# Patient Record
Sex: Female | Born: 1965 | Race: White | Hispanic: No | Smoking: Never smoker
Health system: Southern US, Community
[De-identification: ages and names within clinical notes are randomized; demographics above are authoritative.]

## PROBLEM LIST (undated history)

## (undated) DIAGNOSIS — S060XAA Concussion with loss of consciousness status unknown, initial encounter: Secondary | ICD-10-CM

## (undated) DIAGNOSIS — S060X9A Concussion with loss of consciousness of unspecified duration, initial encounter: Secondary | ICD-10-CM

## (undated) HISTORY — PX: CHOLECYSTECTOMY: SHX55

## (undated) HISTORY — DX: Concussion with loss of consciousness status unknown, initial encounter: S06.0XAA

## (undated) HISTORY — DX: Concussion with loss of consciousness of unspecified duration, initial encounter: S06.0X9A

---

## 1998-07-23 ENCOUNTER — Other Ambulatory Visit: Admission: RE | Admit: 1998-07-23 | Discharge: 1998-07-23 | Payer: Self-pay | Admitting: Obstetrics & Gynecology

## 1999-08-09 ENCOUNTER — Other Ambulatory Visit: Admission: RE | Admit: 1999-08-09 | Discharge: 1999-08-09 | Payer: Self-pay | Admitting: Obstetrics & Gynecology

## 2000-10-24 ENCOUNTER — Other Ambulatory Visit: Admission: RE | Admit: 2000-10-24 | Discharge: 2000-10-24 | Payer: Self-pay | Admitting: Obstetrics & Gynecology

## 2001-03-26 ENCOUNTER — Other Ambulatory Visit: Admission: RE | Admit: 2001-03-26 | Discharge: 2001-03-26 | Payer: Self-pay | Admitting: Obstetrics & Gynecology

## 2002-03-06 ENCOUNTER — Other Ambulatory Visit: Admission: RE | Admit: 2002-03-06 | Discharge: 2002-03-06 | Payer: Self-pay | Admitting: Obstetrics & Gynecology

## 2003-04-06 ENCOUNTER — Other Ambulatory Visit: Admission: RE | Admit: 2003-04-06 | Discharge: 2003-04-06 | Payer: Self-pay | Admitting: Obstetrics & Gynecology

## 2004-06-24 ENCOUNTER — Other Ambulatory Visit: Admission: RE | Admit: 2004-06-24 | Discharge: 2004-06-24 | Payer: Self-pay | Admitting: Obstetrics & Gynecology

## 2005-10-17 ENCOUNTER — Other Ambulatory Visit: Admission: RE | Admit: 2005-10-17 | Discharge: 2005-10-17 | Payer: Self-pay | Admitting: Obstetrics & Gynecology

## 2007-03-23 ENCOUNTER — Inpatient Hospital Stay (HOSPITAL_COMMUNITY): Admission: EM | Admit: 2007-03-23 | Discharge: 2007-03-28 | Payer: Self-pay | Admitting: Emergency Medicine

## 2007-03-23 ENCOUNTER — Encounter: Payer: Self-pay | Admitting: Emergency Medicine

## 2007-04-08 ENCOUNTER — Encounter: Admission: RE | Admit: 2007-04-08 | Discharge: 2007-04-08 | Payer: Self-pay | Admitting: Neurosurgery

## 2011-01-10 NOTE — Consult Note (Signed)
[name], Morris NO.:  [id number]   MEDICAL RECORD NO.:  [id number]          PATIENT TYPE:  INP   LOCATION:  3105                         FACILITY:  [facility]   PHYSICIAN:  Ardeth Sportsman, MD     DATE OF BIRTH:  December 06, 1965   DATE OF CONSULTATION:  DATE OF DISCHARGE:                                 CONSULTATION   PRIMARY CARE PHYSICIAN:  Dr. Mayford Knife, [facility], although she  does go to [facility].   REQUESTING PHYSICIAN:  Margarita Grizzle, RN.   NEUROSURGEON:  Dr. Phoebe Perch.   REASON FOR CONSULTATION:  Fall off horse, with subdural and subarachnoid  hemorrhages and a skull fracture.   HISTORY OF PRESENT ILLNESS:  [name] is a 45 year old female  otherwise pretty healthy, who was riding her horse with her father.  She  cannot recall everything exactly, but apparently the horse was trying to  go between a trot and a gallop, and she thinks she fell forward.  Her  father just remembers looking around and finding her staggering near her  horse.  There was no loss of consciousness, but she is amnestic of part  of the event.  She is complaining of headache and nausea.  She has never  had anything like this before.  She cannot recall any chest pain or  shortness of breath or any vision changes or seizures just prior to  falling off the horse.  She was sent to [place] while in the emergency  department and, based on her evaluation, was transferred over to [facility] -  for trauma evaluation.  She has been seen by Dr. Phoebe Perch with  neurosurgery, and he requested theat the trauma service follow the  patient with him.   PAST MEDICAL HISTORY:  Negative.   PAST SURGICAL HISTORY:  Cholecystectomy.   SOCIAL HISTORY:  Occasionally drinks alcohol, but not recently.  No  tobacco or other drug use.  Works at [employer].   FAMILY HISTORY:  Noncontributory for any major neurological or cardiac  problems.   ALLERGIES:  NONE.   MEDICATIONS:  None.   REVIEW OF  SYSTEMS:  A 15-point review of systems is otherwise rather  normal.  OPHTHALMOLOGIC, ENT, CARDIAC, PULMONARY, GYN, HEPATIC, RENAL,  ENDOCRINE, HEME, LYMPH, ALLERGIES, MUSCULOSKELETAL, NEUROLOGICAL,  PSYCHIATRIC otherwise are negative.  NEUROLOGICAL:  Otherwise noted as  above.  No vision changes or auditory changes.   PHYSICAL EXAMINATION:  VITAL SIGNS:  Her temperature has not been  recorded yet.  Pulse of 91.  Respirations of 20.  Blood pressure 113/64.  Room air 96% to 100%.  Her pain is about a 5 to 6/10.  GENERAL:  She is awake, tired, but uncomfortable, but not frankly toxic.  She is oriented x4.  PSYCH:  She is interactive and seems to have a pretty good immediate and  long-term memory.  There is no evidence of dementia.  Denied any  psychosis or paranoia.  EYES:  Pupils are equal, round and reactive to light.  Extraocular  movements are intact.  Sclerae are not icteric or injected.  There  is no  hyphema.  HEENT:  She seems to be normocephalic.  She does have some tenderness in  her right occiput, but no definite step-off.  Orbit is fine. No raccoon  eyes or battle sign.  Mucous membranes, oropharynx is clear.  She has no  nasal drip or post nasal drip.  Tympanic membranes are clear, with no  hemotympanum.  NECK:  She is already out of the collar.  She has full range of motion  without any pain or discomfort.  Trachea is midline.  No step-off.  HEART:  Regular rate and rhythm.  No murmurs, gallops or rubs.  CHEST:  Clear to auscultation bilaterally.  No wheezes rales or rhonchi.  No pain to rib on strong compression.  BACK:  No pain along her cervicothoracolumbosacral spine, with no  obvious deformity.  ABDOMEN:  The abdomen is soft and nontender, nondistended.  She is  obese.  No umbilical or incisional hernias.  GU:  Normal female genitalia, with no inguinal hernias.  RECTAL:  Refused by patient.  EXTREMITIES:  No clubbing, cyanosis or edema.  MUSCULOSKELETAL:  Full range  of motion of shoulders, elbows, wrists, as  well as hips, knees, and ankles.  There is no pain in her hand or other  major joints or long bones.  LYMPH:  No head, neck, axillary or groin lymphadenopathy.  SKIN:  No obvious petechia.  No Racoon eyes or battle sign.  No other  sores or lesions.   LABORATORY VALUES:  None were drawn.  Chest x-ray is completely normal.  No rib fracture.  CT of the head shows a subtle, but definite  subarachnoid hemorrhage in bilateral frontal and temporal regions.  She  has some possible small subdural hematoma bilaterally, less than 2 mm in  size.  She has a left parietal occipital fracture and some fluid in her  right maxillary sinus.  It is unclear whether blood.  CT of the neck is  normal, except for some slight loss of curvature, but no herniation or  fracture.   ASSESSMENT AND PLAN:  5. A 45 year old female status post fall with subarachnoid and      subdural hemorrhage.  2. We discussed the case with Dr. Phoebe Perch.  He feels comfortable      admitting the patient as long as we will help follow with them to      make sure we are not missing anything.   Therefore I recommend:  1. CBC and basic metabolic panel and urinalysis, to make sure we are      not missing anything.  2. Serial abdominal exams.  3. Probably could advance diet when nausea is under better control.  4. Aggressive pain control.  5. Agree with followup CT scan in the next 24 to 36 hours to make sure      there is no progression of any abnormalities. 6) Avoid any      anticoagulation.  6. DVT prophylaxis with SCD.  7. Ulcer prophylaxis with Protonix until eating a regular diet.  8. Neurosurgical care.     Ardeth Sportsman, MD  Electronically Signed    SCG/MEDQ  D:  03/23/2007  T:  03/23/2007  Job:  [id number]

## 2011-01-13 NOTE — Discharge Summary (Signed)
[name], CUPO NO.:  [id number]   MEDICAL RECORD NO.:  [id number]          PATIENT TYPE:  INP   LOCATION:  3031                         FACILITY:  [facility]   PHYSICIAN:  Clydene Fake, M.D.  DATE OF BIRTH:  August 03, 1966   DATE OF ADMISSION:  03/23/2007  DATE OF DISCHARGE:  03/28/2007                               DISCHARGE SUMMARY   DIAGNOSIS:  Closed head injury.   DISCHARGE DIAGNOSES:  None.   PROCEDURES:  None.   REASON FOR ADMISSION:  A 45 year old white female who fell off a horse,  causing amnesia for Veronica event, got up and ambulated at Veronica scene, came  in with a headache, was brought to [facility] emergency room.  CT scan was  done.  There is concern for bitemporal small subdural hematomas.  No  mass effect, early contusions, subarachnoid hemorrhage frontal base,  left greater than right, with no mass effect in Veronica occipital, no  displaced skull fracture.  C-spine negative.  Veronica patient admitted in  Veronica ICU for close observation.  Trauma team consulted for concurrent  care.  Veronica patient remained neurologically intact.  During Veronica next  couple of days on Veronica 28th, another CT was done showing resolution of  bitemporal subdural hematomas.  Contusion as well as some edema was seen  at Veronica base, worse on Veronica left frontal lobe, kind of as expected but no  significant mass effects.  Veronica patient still had some headaches and  nauseousness, was slowly  starting to increase activity and started  getting out of bed.  She continued to slightly improve with her  symptoms.  About Veronica 30th, she still had some slight headaches and  slight dizziness as she was moving around but was ambulating and  starting to eat, less nauseous.  Physical therapy, occupational therapy  worked with Veronica patient, but she continued making improvement and on  March 28, 2007, discharged home in stable condition.  No strenuous  activity with a CT scan of Veronica head in 2 weeks.  Followup in my office  after that.           ______________________________  Clydene Fake, M.D.     JRH/MEDQ  D:  04/25/2007  T:  04/25/2007  Job:  [id number]

## 2011-06-12 LAB — URINE MICROSCOPIC-ADD ON

## 2011-06-12 LAB — BASIC METABOLIC PANEL
BUN: 6
CO2: 25
CO2: 26
Calcium: 8.3 — ABNORMAL LOW
Calcium: 8.9
Calcium: 9
Chloride: 105
Chloride: 105
Chloride: 107
Creatinine, Ser: 0.54
Creatinine, Ser: 0.56
Creatinine, Ser: 0.61
GFR calc Af Amer: >60
GFR calc Af Amer: >60
GFR calc non Af Amer: >60
Glucose, Bld: 111 — ABNORMAL HIGH
Sodium: 140

## 2011-06-12 LAB — CBC
Hemoglobin: 12.3
MCHC: 33.8
MCHC: 34.2
MCV: 88.1
Platelets: 283
RBC: 4.12
WBC: 20.6 — ABNORMAL HIGH
WBC: 23.7 — ABNORMAL HIGH

## 2011-06-12 LAB — PROTIME-INR
INR: 1
Prothrombin Time: 12.9

## 2011-06-12 LAB — URINALYSIS, ROUTINE W REFLEX MICROSCOPIC
Bilirubin Urine: NEGATIVE
Glucose, UA: NEGATIVE
Protein, ur: NEGATIVE
Urobilinogen, UA: 0.2

## 2011-06-12 LAB — URINE CULTURE
Culture: NO GROWTH
Special Requests: NEGATIVE

## 2012-02-12 ENCOUNTER — Ambulatory Visit (INDEPENDENT_AMBULATORY_CARE_PROVIDER_SITE_OTHER): Payer: 59 | Admitting: Family Medicine

## 2012-02-12 ENCOUNTER — Ambulatory Visit: Payer: 59

## 2012-02-12 VITALS — BP 117/71 | HR 89 | Temp 98.8°F | Resp 18 | Ht 64.5 in | Wt 283.0 lb

## 2012-02-12 DIAGNOSIS — S90852A Superficial foreign body, left foot, initial encounter: Secondary | ICD-10-CM

## 2012-02-12 DIAGNOSIS — M79609 Pain in unspecified limb: Secondary | ICD-10-CM

## 2012-02-12 DIAGNOSIS — M79672 Pain in left foot: Secondary | ICD-10-CM

## 2012-02-12 DIAGNOSIS — S91309A Unspecified open wound, unspecified foot, initial encounter: Secondary | ICD-10-CM

## 2012-02-12 DIAGNOSIS — IMO0002 Reserved for concepts with insufficient information to code with codable children: Secondary | ICD-10-CM

## 2012-02-12 NOTE — Patient Instructions (Signed)

## 2012-02-12 NOTE — Progress Notes (Signed)
Verbal consent obtained from the patient.  Local anesthesia with 3cc Lidocaine 1% without epinephrine.  Wound scrubbed with soap and water and rinsed.  Wound closed with 1 4-0 Prolene horizontal mattress suture.  Wound cleansed and dressed.

## 2012-02-12 NOTE — Progress Notes (Signed)
Subjective: 46 year old lady who cut her left  foot on the glass of the bulb of having been. She was able to pull out the splinter of glass. She did not see a physician yesterday. She cleaned it well with peroxide.  Objective: Moving on mid arch of her foot itches slightly over 1 cm in length. Wound is clean. 1% lidocaine was used to anesthetize locally, and probing with a hemostat does not get a click of glass.  Assessment: Wound  Left foot, approximately 1 cm deep  Plan: X-ray foot Tetanus shot up-to-date 2 years ago Will need irrigating and would probably heal better with a suture in it.  [facility] reading (PRIMARY) by  Dr. Alwyn Ren Negative foot xray for foreign body  Discussed with patient that I believe this will do better with sutures in it, and she agrees.Marland Kitchen

## 2012-02-22 ENCOUNTER — Ambulatory Visit (INDEPENDENT_AMBULATORY_CARE_PROVIDER_SITE_OTHER): Payer: 59 | Admitting: Family Medicine

## 2012-02-22 VITALS — BP 123/77 | HR 91

## 2012-02-22 DIAGNOSIS — Z4802 Encounter for removal of sutures: Secondary | ICD-10-CM

## 2012-02-22 DIAGNOSIS — S91309A Unspecified open wound, unspecified foot, initial encounter: Secondary | ICD-10-CM

## 2012-02-22 NOTE — Progress Notes (Signed)
Subjective: Patient is here to recheck the wound on her foot. It's doing well  Objective: Wound healed nicely. Single suture removed.  Assessment: Wound foot, resolved  Plan: Return when necessary

## 2012-03-27 ENCOUNTER — Ambulatory Visit (INDEPENDENT_AMBULATORY_CARE_PROVIDER_SITE_OTHER): Payer: 59 | Admitting: Emergency Medicine

## 2012-03-27 VITALS — BP 136/76 | HR 117 | Temp 99.7°F | Resp 18 | Ht 65.0 in | Wt 285.0 lb

## 2012-03-27 DIAGNOSIS — J020 Streptococcal pharyngitis: Secondary | ICD-10-CM

## 2012-03-27 MED ORDER — AMOXICILLIN 875 MG PO TABS: 875.0000 mg | ORAL_TABLET | Freq: Two times a day (BID) | ORAL | Status: AC

## 2012-03-27 NOTE — Progress Notes (Signed)
   Date:  03/27/2012   Name:  Veronica Morris   DOB:  02-Sep-1965   MRN:  [id number]  PCP:  Tally Due, MD    Chief Complaint: Sore Throat   History of Present Illness:  Veronica Morris is a 46 y.o. very pleasant female patient who presents with the following:  Yesterday developed sore throat that kept her up all night.  Now has fever and chills.  No nausea or vomiting.  No cough.  Had coryza last week.  No other complaints  Patient Active Problem List  Diagnosis  . Wound, open, foot    No past medical history on file.  No past surgical history on file.  History  Substance Use Topics  . Smoking status: Never Smoker   . Smokeless tobacco: Not on file  . Alcohol Use: Not on file    No family history on file.  No Known Allergies  Medication list has been reviewed and updated.  No current outpatient prescriptions on file prior to visit.    Review of Systems:  As per HPI, otherwise negative.    Physical Examination: Filed Vitals:   03/27/12 1009  BP: 136/76  Pulse: 117  Temp: 99.7 F (37.6 C)  Resp: 18   Filed Vitals:   03/27/12 1009  Height: 5\' 5"  (1.651 m)  Weight: 285 lb (129.275 kg)   Body mass index is 47.43 kg/(m^2). Ideal Body Weight: Weight in (lb) to have BMI = 25: 149.9   GEN: WDWN, NAD, Non-toxic, A & O x 3 HEENT: Atraumatic, Normocephalic. Neck supple. No masses, No LAD.  Tonsils enlarged and red with exudate.  Anterior cervical tenderness Ears and Nose: No external deformity. CV: RRR, No M/G/R. No JVD. No thrill. No extra heart sounds. PULM: CTA B, no wheezes, crackles, rhonchi. No retractions. No resp. distress. No accessory muscle use. ABD: S, NT, ND, +BS. No rebound. No HSM. EXTR: No c/c/e NEURO Normal gait.  PSYCH: Normally interactive. Conversant. Not depressed or anxious appearing.  Calm demeanor.    Assessment and Plan: Strep Amoxicillin Follow up as needed  Carmelina Dane, MD

## 2012-03-27 NOTE — Patient Instructions (Signed)
Strep Infections  Streptococcal (strep) infections are caused by streptococcal germs (bacteria). Strep infections are very contagious. Strep infections can occur in:   Ears.   The nose.   The throat.   Sinuses.   Skin.   Blood.   Lungs.   Spinal fluid.   Urine.  Strep throat is the most common bacterial infection in children. The symptoms of a Strep infection usually get better in 2 to 3 days after starting medicine that kills germs (antibiotics). Strep is usually not contagious after 36 to 48 hours of antibiotic treatment. Strep infections that are not treated can cause serious complications. These include gland infections, throat abscess, rheumatic fever and kidney disease.  DIAGNOSIS   The diagnosis of strep is made by:   A culture for the strep germ.  TREATMENT   These infections require oral antibiotics for a full 10 days, an antibiotic shot or antibiotics given into the vein (intravenous, IV).  HOME CARE INSTRUCTIONS    Be sure to finish all antibiotics even if feeling better.   Only take over-the-counter medicines for pain, discomfort and or fever, as directed by your caregiver.   Close contacts that have a fever, sore throat or illness symptoms should see their caregiver right away.   You or your child may return to work, school or daycare if the fever and pain are better in 2 to 3 days after starting antibiotics.  SEEK MEDICAL CARE IF:    You or your child has an oral temperature above 102 F (38.9 C).   Your baby is older than 3 months with a rectal temperature of 100.5 F (38.1 C) or higher for more than 1 day.   You or your child is not better in 3 days.  SEEK IMMEDIATE MEDICAL CARE IF:    You or your child has an oral temperature above 102 F (38.9 C), not controlled by medicine.   Your baby is older than 3 months with a rectal temperature of 102 F (38.9 C) or higher.   Your baby is 3 months old or younger with a rectal temperature of 100.4 F (38 C) or higher.   There is a  spreading rash.   There is difficulty swallowing or breathing.   There is increased pain or swelling.  Document Released: 09/21/2004 Document Revised: 08/03/2011 Document Reviewed: 06/30/2009  Exit[employer] Patient Information 2012 [employer].

## 2012-04-21 ENCOUNTER — Ambulatory Visit (INDEPENDENT_AMBULATORY_CARE_PROVIDER_SITE_OTHER): Payer: 59 | Admitting: Family Medicine

## 2012-04-21 VITALS — BP 126/81 | HR 92 | Temp 98.0°F | Resp 16 | Ht 64.0 in | Wt 285.0 lb

## 2012-04-21 DIAGNOSIS — R42 Dizziness and giddiness: Secondary | ICD-10-CM

## 2012-04-21 DIAGNOSIS — J321 Chronic frontal sinusitis: Secondary | ICD-10-CM

## 2012-04-21 DIAGNOSIS — J309 Allergic rhinitis, unspecified: Secondary | ICD-10-CM

## 2012-04-21 LAB — POCT URINALYSIS DIPSTICK
Bilirubin, UA: NEGATIVE
Glucose, UA: NEGATIVE
Nitrite, UA: NEGATIVE
pH, UA: 5.5

## 2012-04-21 LAB — POCT CBC
HCT, POC: 43.1 % (ref 37.7–47.9)
Hemoglobin: 12.8 g/dL (ref 12.2–16.2)
Lymph, poc: 2.9 (ref 0.6–3.4)
MCH, POC: 27.4 pg (ref 27–31.2)
MCHC: 29.7 g/dL — AB (ref 31.8–35.4)
MCV: 92 fL (ref 80–97)
MPV: 7.7 fL (ref 0–99.8)
POC MID %: 6.8 %M (ref 0–12)
RBC: 4.68 M/uL (ref 4.04–5.48)
WBC: 10.5 10*3/uL — AB (ref 4.6–10.2)

## 2012-04-21 MED ORDER — FLUTICASONE PROPIONATE 50 MCG/ACT NA SUSP: 2.0000 | Freq: Every day | NASAL | Status: DC

## 2012-04-21 MED ORDER — AMOXICILLIN-POT CLAVULANATE 875-125 MG PO TABS: 1.0000 | ORAL_TABLET | Freq: Two times a day (BID) | ORAL | Status: AC

## 2012-04-21 NOTE — Patient Instructions (Addendum)
1. Dizziness  POCT CBC, POCT urinalysis dipstick, Comprehensive metabolic panel, TSH, EKG 12-Lead, POCT glucose (manual entry)  2. Allergic rhinitis  fluticasone (FLONASE) 50 MCG/ACT nasal spray  3. Sinusitis chronic, frontal  amoxicillin-clavulanate (AUGMENTIN) 875-125 MG per tablet   Dizziness Dizziness is a common problem. It is a feeling of unsteadiness or lightheadedness. You may feel like you are about to faint. Dizziness can lead to injury if you stumble or fall. A person of any age group can suffer from dizziness, but dizziness is more common in older adults. CAUSES  Dizziness can be caused by many different things, including:  Middle ear problems.   Standing for too long.   Infections.   An allergic reaction.   Aging.   An emotional response to something, such as the sight of blood.   Side effects of medicines.   Fatigue.   Problems with circulation or blood pressure.   Excess use of alcohol, medicines, or illegal drug use.   Breathing too fast (hyperventilation).   An arrhythmia or problems with your heart rhythm.   Low red blood cell count (anemia).   Pregnancy.   Vomiting, diarrhea, fever, or other illnesses that cause dehydration.   Diseases or conditions such as Parkinson's disease, high blood pressure (hypertension), diabetes, and thyroid problems.   Exposure to extreme heat.  DIAGNOSIS  To find the cause of your dizziness, your caregiver may do a physical exam, lab tests, radiologic imaging scans, or an electrocardiography test (ECG).  TREATMENT  Treatment of dizziness depends on the cause of your symptoms and can vary greatly. HOME CARE INSTRUCTIONS   Drink enough fluids to keep your urine clear or pale yellow. This is especially important in very hot weather. In the elderly, it is also important in cold weather.   If your dizziness is caused by medicines, take them exactly as directed. When taking blood pressure medicines, it is especially important  to get up slowly.   Rise slowly from chairs and steady yourself until you feel okay.   In the morning, first sit up on the side of the bed. When this seems okay, stand slowly while holding onto something until you know your balance is fine.   If you need to stand in one place for a long time, be sure to move your legs often. Tighten and relax the muscles in your legs while standing.   If dizziness continues to be a problem, have someone stay with you for a day or two. Do this until you feel you are well enough to stay alone. Have the person call your caregiver if he or she notices changes in you that are concerning.   Do not drive or use heavy machinery if you feel dizzy.  SEEK IMMEDIATE MEDICAL CARE IF:   Your dizziness or lightheadedness gets worse.   You feel nauseous or vomit.   You develop problems with talking, walking, weakness, or using your arms, hands, or legs.   You are not thinking clearly or you have difficulty forming sentences. It may take a friend or family member to determine if your thinking is normal.   You develop chest pain, abdominal pain, shortness of breath, or sweating.   Your vision changes.   You notice any bleeding.   You have side effects from medicine that seems to be getting worse rather than better.  MAKE SURE YOU:   Understand these instructions.   Will watch your condition.   Will get help right away if you  are not doing well or get worse.  Document Released: 02/07/2001 Document Revised: 08/03/2011 Document Reviewed: 03/03/2011 [facility] Patient Information 2012 [place], [place].

## 2012-04-21 NOTE — Progress Notes (Signed)
Subjective:    Patient ID: Veronica Morris, female    DOB: 03/26/1966, 46 y.o.   MRN: [id number]  HPI This 46 y.o. female presents for evaluation of dizziness.  Onset of dizziness 48 hours ago.  Head injury 2008 with dizziness after horse accident.  Occurs with walking; feels drunk; head is really foggy; numbness around lips intermittent.  +nausea but no vomiting.  No room spinning; when laid down last night, had ringing in ears on one side; does not have ocean noise during the day.  No hearing loss.  No blurred vision; no diplopia; no weakness; no confusion.  No recurrent head trauma.  Has been working out with trainer for several weeks.  +recent cold with congestion one week ago; +sneezing, +rhinorrhea; +nasal congestion; +sinus pressure.  Took a Midol, Sudafed.  No headache but was with onset.  Felt really congested yesterday but improved today.  Did not take Sudafed today.  Dizziness no change in three days.  Able to function.  No syncope; no falls.  Turning head does not trigger it.  Rolling over in bed did not trigger.  Laying supine did not make worse. Feels better when in bed.  Not pregnant; +menses.  Not sexually active.  PMH:  Allergic Rhinitis Psurg:  Cholecysectomy 1006; admission NICU for head trauma with intracranial bleed not warranting surgical resection [place] All:  None Medications: Zyrtec 10mg  daily. Family:  M: living, RLS.  F: living, BPH, HTN  Sibling:sister: chronic UTIs.  Brother-healthy   Review of Systems  Constitutional: Negative for fever, chills, diaphoresis and fatigue.  HENT: Positive for congestion, rhinorrhea, sneezing, voice change, postnasal drip and tinnitus. Negative for hearing loss, ear pain, sore throat, facial swelling, mouth sores, trouble swallowing, neck pain, neck stiffness, dental problem and ear discharge.   Eyes: Negative for pain, discharge and itching.  Respiratory: Negative for cough and shortness of breath.   Cardiovascular: Negative for  chest pain, palpitations and leg swelling.  Gastrointestinal: Positive for nausea. Negative for vomiting, abdominal pain, diarrhea and constipation.  Skin: Negative for rash.  Neurological: Positive for dizziness and headaches. Negative for tremors, seizures, syncope, facial asymmetry, speech difficulty, weakness, light-headedness and numbness.    No past medical history on file.  No past surgical history on file.  Prior to Admission medications   Medication Sig Start Date End Date Taking? Authorizing Provider  cetirizine (ZYRTEC) 10 MG tablet Take 10 mg by mouth daily.   Yes Historical Provider, MD  amoxicillin-clavulanate (AUGMENTIN) 875-125 MG per tablet Take 1 tablet by mouth 2 (two) times daily. 04/21/12 05/01/12  Ethelda Chick, MD  fluticasone (FLONASE) 50 MCG/ACT nasal spray Place 2 sprays into the nose daily. 04/21/12 04/21/13  Ethelda Chick, MD    No Known Allergies  History   Social History  . Marital Status: Divorced    Spouse Name: N/A    Number of Children: N/A  . Years of Education: N/A   Occupational History  . Not on file.   Social History Main Topics  . Smoking status: Never Smoker   . Smokeless tobacco: Not on file  . Alcohol Use: Not on file  . Drug Use: Not on file  . Sexually Active: Not on file   Other Topics Concern  . Not on file   Social History Narrative  . No narrative on file    No family history on file.      Objective:   Physical Exam  Nursing note and vitals reviewed.  Constitutional: She is oriented to person, place, and time. She appears well-developed and well-nourished. No distress.  HENT:  Head: Normocephalic and atraumatic.  Right Ear: External ear normal.  Left Ear: External ear normal.  Nose: Nose normal.  Mouth/Throat: Oropharynx is clear and moist.       +TTP FRONTAL SINUS REGION.  Eyes: Conjunctivae and EOM are normal.  Neck: Normal range of motion. Neck supple. No JVD present. No tracheal deviation present. No  thyromegaly present.  Cardiovascular: Normal rate, regular rhythm, normal heart sounds and intact distal pulses.   No murmur heard. Pulmonary/Chest: Effort normal and breath sounds normal.  Abdominal: Soft. Bowel sounds are normal.  Lymphadenopathy:    She has no cervical adenopathy.  Neurological: She is alert and oriented to person, place, and time. She has normal reflexes. No cranial nerve deficit. She exhibits normal muscle tone. Coordination normal.       ROMBERG NEGATIVE.  Skin: Skin is warm and dry. She is not diaphoretic.  Psychiatric: She has a normal mood and affect. Her behavior is normal. Judgment and thought content normal.    Results for orders placed in visit on 04/21/12  POCT CBC      Component Value Range   WBC 10.5 (*) 4.6 - 10.2 K/uL   Lymph, poc 2.9  0.6 - 3.4   POC LYMPH PERCENT 27.7  10 - 50 %L   MID (cbc) 0.7  0 - 0.9   POC MID % 6.8  0 - 12 %M   POC Granulocyte 6.9  2 - 6.9   Granulocyte percent 65.5  37 - 80 %G   RBC 4.68  4.04 - 5.48 M/uL   Hemoglobin 12.8  12.2 - 16.2 g/dL   HCT, POC 16.1  09.6 - 47.9 %   MCV 92.0  80 - 97 fL   MCH, POC 27.4  27 - 31.2 pg   MCHC 29.7 (*) 31.8 - 35.4 g/dL   RDW, POC 04.5     Platelet Count, POC 346  142 - 424 K/uL   MPV 7.7  0 - 99.8 fL  POCT URINALYSIS DIPSTICK      Component Value Range   Color, UA amber     Clarity, UA cloudy     Glucose, UA negative     Bilirubin, UA negative     Ketones, UA trace     Spec Grav, UA >=1.030     Blood, UA large     pH, UA 5.5     Protein, UA 30     Urobilinogen, UA 0.2     Nitrite, UA negative     Leukocytes, UA Trace    GLUCOSE, POCT (MANUAL RESULT ENTRY)      Component Value Range   POC Glucose 102 (*) 70 - 99 mg/dl       Assessment & Plan:   1. Dizziness  POCT CBC, POCT urinalysis dipstick, Comprehensive metabolic panel, TSH, EKG 12-Lead, POCT glucose (manual entry)  2. Allergic rhinitis  fluticasone (FLONASE) 50 MCG/ACT nasal spray  3. Sinusitis chronic, frontal   amoxicillin-clavulanate (AUGMENTIN) 875-125 MG per tablet     1.  Dizziness: New.  Secondary to acute sinusitis, allergic rhinitis.  Obtain labs to rule out secondary causes of dizziness.  Supportive care with hydration, standing slowly.  RTC for acute worsening.  Not consistent with benign positional vertigo.  Not interfering with daily activities. 2 Allergic Rhinitis: Chronic issue with recent worsening.  Rx for Fluticasone spray; continue oral  antihistamine. 3.  Acute Sinusitis Frontal:  New.  Rx for Augmentin provided.  Also rx for Fluticasone.

## 2012-04-22 LAB — TSH: TSH: 3.068 u[IU]/mL (ref 0.350–4.500)

## 2012-04-22 LAB — COMPREHENSIVE METABOLIC PANEL
ALT: 11 U/L (ref 0–35)
AST: 11 U/L (ref 0–37)
Albumin: 4.1 g/dL (ref 3.5–5.2)
BUN: 13 mg/dL (ref 6–23)
CO2: 26 mEq/L (ref 19–32)
Calcium: 9.3 mg/dL (ref 8.4–10.5)
Chloride: 106 mEq/L (ref 96–112)
Potassium: 4 mEq/L (ref 3.5–5.3)

## 2012-04-23 ENCOUNTER — Telehealth: Payer: Self-pay

## 2012-04-23 MED ORDER — MECLIZINE HCL 50 MG PO TABS: 50.0000 mg | ORAL_TABLET | Freq: Three times a day (TID) | ORAL | Status: AC | PRN

## 2012-04-23 NOTE — Telephone Encounter (Signed)
Sent in antivert. If symptoms persist RTC.

## 2012-04-23 NOTE — Addendum Note (Signed)
Addended by: Sondra Barges on: 04/23/2012 07:12 PM   Modules accepted: Orders

## 2012-04-23 NOTE — Telephone Encounter (Signed)
PT STATES SHE WAS SEEN AND THE DR HAD ASKED IF SHE WANTED SOMETHING FOR DIZZINESS AND SHE DECLINED AT THE TIME, BUT NOW REALLY NEED SOMETHING BECAUSE SHE ISN'T MUCH BETTER. PLEASE CALL [phone]   [facility] IN [place]

## 2012-04-24 NOTE — Progress Notes (Signed)
Reviewed and agree.

## 2012-04-24 NOTE — Telephone Encounter (Signed)
Patient advised, was sent to [facility], patient has gotten it.

## 2012-07-15 ENCOUNTER — Other Ambulatory Visit: Payer: Self-pay | Admitting: Obstetrics & Gynecology

## 2012-07-15 DIAGNOSIS — R928 Other abnormal and inconclusive findings on diagnostic imaging of breast: Secondary | ICD-10-CM

## 2012-07-22 ENCOUNTER — Ambulatory Visit
Admission: RE | Admit: 2012-07-22 | Discharge: 2012-07-22 | Disposition: A | Discharge status: Home / Self Care | Payer: 59 | Source: Ambulatory Visit | Attending: Obstetrics & Gynecology | Admitting: Obstetrics & Gynecology

## 2012-07-22 DIAGNOSIS — R928 Other abnormal and inconclusive findings on diagnostic imaging of breast: Secondary | ICD-10-CM

## 2012-08-13 ENCOUNTER — Ambulatory Visit (INDEPENDENT_AMBULATORY_CARE_PROVIDER_SITE_OTHER): Payer: 59 | Admitting: Family Medicine

## 2012-08-13 VITALS — BP 116/80 | HR 103 | Temp 98.6°F | Resp 17 | Ht 64.5 in | Wt 287.0 lb

## 2012-08-13 DIAGNOSIS — D692 Other nonthrombocytopenic purpura: Secondary | ICD-10-CM

## 2012-08-13 DIAGNOSIS — R21 Rash and other nonspecific skin eruption: Secondary | ICD-10-CM

## 2012-08-13 LAB — POCT UA - MICROSCOPIC ONLY
Bacteria, U Microscopic: NEGATIVE
Casts, Ur, LPF, POC: NEGATIVE
Crystals, Ur, HPF, POC: NEGATIVE

## 2012-08-13 LAB — POCT CBC
HCT, POC: 41.7 % (ref 37.7–47.9)
Hemoglobin: 12.5 g/dL (ref 12.2–16.2)
MPV: 7.7 fL (ref 0–99.8)
POC Granulocyte: 7.8 — AB (ref 2–6.9)
POC MID %: 6.3 %M (ref 0–12)
RBC: 4.57 M/uL (ref 4.04–5.48)
WBC: 13.2 10*3/uL — AB (ref 4.6–10.2)

## 2012-08-13 LAB — POCT URINALYSIS DIPSTICK
Nitrite, UA: NEGATIVE
Protein, UA: 30
Urobilinogen, UA: 0.2
pH, UA: 6

## 2012-08-13 NOTE — Progress Notes (Signed)
Subjective: Off and on since before Thanksgiving patient has had a rash on her legs and feet which comes and goes. It only itches mildly, is fairly asymptomatic. She thought she was getting bites there but says something is not right but it. She denies any other systemic illnesses or UTI or hematuria or such problems. Review of systems unremarkable. Is on her menstrual cycle so urinary blood won't need much.  Objective: Vasculitic-appearing rash on both feet. He has palpable central blistering with Dr. deep red erythematous borders. Some are just little red spots others are bigger up to 2 cm in diameter the largest one. There are some at the base of her toes which are knobby looking. No rash on the upper half of the body though she has some old acne stuff on her back. She has a couple of places at the back of the leg which probably represent these vasculitic lesions.  Assessment: Purpura, palpable Rash  Plan: Get a UA, CBC, complete metabolic panel, sedimentation rate, ANA Refer to dermatologist Took photos I had one of the other physicians look at this with me.  Results for orders placed in visit on 08/13/12  POCT CBC      Component Value Range   WBC 13.2 (*) 4.6 - 10.2 K/uL   Lymph, poc 4.5 (*) 0.6 - 3.4   POC LYMPH PERCENT 34.3  10 - 50 %L   MID (cbc) 0.8  0 - 0.9   POC MID % 6.3  0 - 12 %M   POC Granulocyte 7.8 (*) 2 - 6.9   Granulocyte percent 59.4  37 - 80 %G   RBC 4.57  4.04 - 5.48 M/uL   Hemoglobin 12.5  12.2 - 16.2 g/dL   HCT, POC 16.1  09.6 - 47.9 %   MCV 91.3  80 - 97 fL   MCH, POC 27.4  27 - 31.2 pg   MCHC 30.0 (*) 31.8 - 35.4 g/dL   RDW, POC 04.5     Platelet Count, POC 397  142 - 424 K/uL   MPV 7.7  0 - 99.8 fL  POCT URINALYSIS DIPSTICK      Component Value Range   Color, UA yellowish red     Clarity, UA clear     Glucose, UA neg     Bilirubin, UA neg     Ketones, UA neg     Spec Grav, UA >=1.030     Blood, UA large     pH, UA 6.0     Protein, UA 30     Urobilinogen, UA 0.2     Nitrite, UA neg     Leukocytes, UA small (1+)    POCT UA - MICROSCOPIC ONLY      Component Value Range   WBC, Ur, HPF, POC 2-6     RBC, urine, microscopic tntc     Bacteria, U Microscopic neg     Mucus, UA neg     Epithelial cells, urine per micros 2-6     Crystals, Ur, HPF, POC neg     Casts, Ur, LPF, POC neg     Yeast, UA neg     Leukocytosis is noted. However I did not treat anything tonight and do other tests come back.

## 2012-08-13 NOTE — Patient Instructions (Signed)
We will make a referral to the dermatologist. If you do not hear from [place] in 2 days call back to the referrals desk and asked if they have been able to make one.

## 2012-08-14 LAB — COMPREHENSIVE METABOLIC PANEL
ALT: 10 U/L (ref 0–35)
Albumin: 4.4 g/dL (ref 3.5–5.2)
CO2: 25 mEq/L (ref 19–32)
Calcium: 8.9 mg/dL (ref 8.4–10.5)
Chloride: 105 mEq/L (ref 96–112)
Sodium: 141 mEq/L (ref 135–145)
Total Protein: 7.5 g/dL (ref 6.0–8.3)

## 2012-08-15 ENCOUNTER — Encounter: Payer: Self-pay | Admitting: Family Medicine

## 2012-08-15 ENCOUNTER — Telehealth: Payer: Self-pay

## 2012-08-15 LAB — ANTI-NUCLEAR AB-TITER (ANA TITER): ANA Titer 1: 1:80 {titer} — ABNORMAL HIGH

## 2012-08-15 LAB — ANA: Anti Nuclear Antibody(ANA): POSITIVE — AB

## 2012-08-15 NOTE — Telephone Encounter (Signed)
[facility] is requesting most recent medical records ov, labs all other info please fax [phone] any questions call [phone]. PATIENT IN OFFICE NOW

## 2012-08-15 NOTE — Telephone Encounter (Signed)
Last office notes sent thru Epic to [facility].

## 2012-08-17 LAB — WOUND CULTURE: Gram Stain: NONE SEEN

## 2012-08-19 ENCOUNTER — Encounter: Payer: Self-pay | Admitting: *Deleted

## 2012-08-19 ENCOUNTER — Telehealth: Payer: Self-pay

## 2012-08-19 NOTE — Telephone Encounter (Signed)
KIM FROM [facility] STATES THEY HAD REQUESTED RECORDS ON PT LAST WEEK AND STILL HAVEN'T RECEIVED THEM PLEASE FAX TO [phone] AND THE PHONE NUMBER IS [phone]

## 2012-08-19 NOTE — Telephone Encounter (Signed)
Records sent again to [facility] at [facility].

## 2012-08-22 ENCOUNTER — Telehealth: Payer: Self-pay

## 2012-08-22 NOTE — Telephone Encounter (Addendum)
DR.HOPPER, DR. Darl Pikes STINEHELFER IS CALLING IN REGARDS TO A MUTUAL PT, SHE IS AWARE THAT YOU ARE NOT IN UNTIL 08/23/12 BUT WANTED TO MAKE SURE THAT YOU CONTACT HER REGARDING THIS PT AT [phone] OR [phone] OPTION 2.

## 2012-08-24 NOTE — Telephone Encounter (Signed)
I discussed this with the consultant.  Path confirmed vasculitis and patient will be referred on to a rheumatologist by her.

## 2012-08-30 ENCOUNTER — Telehealth: Payer: Self-pay

## 2012-08-30 NOTE — Telephone Encounter (Signed)
Pt was in to see dr hopper and says that her rash is worse would like for [place] to call her about this at [phone]

## 2012-09-01 NOTE — Telephone Encounter (Signed)
Pt was calling about her blisters on her feet and the red dots. She states she will call and se if the dermatology can see her sooner.

## 2012-09-04 ENCOUNTER — Ambulatory Visit (INDEPENDENT_AMBULATORY_CARE_PROVIDER_SITE_OTHER): Payer: 59 | Admitting: Family Medicine

## 2012-09-04 VITALS — BP 132/85 | HR 114 | Temp 98.0°F | Resp 17 | Ht 65.5 in | Wt 280.0 lb

## 2012-09-04 DIAGNOSIS — I776 Arteritis, unspecified: Secondary | ICD-10-CM

## 2012-09-04 DIAGNOSIS — R21 Rash and other nonspecific skin eruption: Secondary | ICD-10-CM

## 2012-09-04 LAB — POCT CBC
Lymph, poc: 3.3 (ref 0.6–3.4)
MCH, POC: 27.6 pg (ref 27–31.2)
MCHC: 31.3 g/dL — AB (ref 31.8–35.4)
MID (cbc): 0.9 (ref 0–0.9)
MPV: 7.8 fL (ref 0–99.8)
POC LYMPH PERCENT: 23.1 %L (ref 10–50)
POC MID %: 6.6 %M (ref 0–12)
Platelet Count, POC: 379 10*3/uL (ref 142–424)
RDW, POC: 12.2 %
WBC: 14.3 10*3/uL — AB (ref 4.6–10.2)

## 2012-09-04 LAB — POCT URINALYSIS DIPSTICK
Glucose, UA: NEGATIVE
Ketones, UA: NEGATIVE
Protein, UA: 100
Spec Grav, UA: >=1.03

## 2012-09-04 LAB — POCT UA - MICROSCOPIC ONLY

## 2012-09-04 MED ORDER — PREDNISONE 20 MG PO TABS: ORAL_TABLET | ORAL | Status: DC

## 2012-09-04 NOTE — Patient Instructions (Addendum)
I will call you but I hear back from Dr. Nickola Major.  If you have not heard back from me by midday tomorrow please call me back.

## 2012-09-04 NOTE — Progress Notes (Signed)
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 Over the recent days the rash has gotten much worse, from her thighs down. Her legs it started aching her more. She's had more swelling in them. She has a business trip in a couple of weeks, and she really wants to feel better than this before going to the lab. Dr Barbaraann Cao, the dermatologist, strictly on the phone after she saw the patient and get the biopsy back. She initiated a referral to the rheumatologist, Dr. Marylene Land lacks. That appointment is not until sometime in February. The patient denies any other major systemic symptoms.  Objective: Severe vasculitic rash in small splotches from about 2 mm to almost 2 cm in diameter, on the feet (sparing the soles), ankles, calves, knees, and thighs. None noted on the rest of the body.   Results for orders placed in visit on 09/04/12  POCT UA - MICROSCOPIC ONLY      Component Value Range   WBC, Ur, HPF, POC tntc     RBC, urine, microscopic 7-12     Bacteria, U Microscopic trace     Mucus, UA neg     Epithelial cells, urine per micros 5-10     Crystals, Ur, HPF, POC       Casts, Ur, LPF, POC renal tubular and WBC cast     Yeast, UA neg    POCT URINALYSIS DIPSTICK      Component Value Range   Color, UA yellow     Clarity, UA cloudy     Glucose, UA neg     Bilirubin, UA neg     Ketones, UA neg     Spec Grav, UA >=1.030     Blood, UA large     pH, UA 5.0     Protein, UA 100     Urobilinogen, UA 0.2     Nitrite, UA neg     Leukocytes, UA moderate (2+)    POCT CBC      Component Value Range   WBC 14.3 (*) 4.6 - 10.2 K/uL   Lymph, poc 3.3  0.6 - 3.4   POC LYMPH PERCENT 23.1  10 - 50 %L   MID (cbc) 0.9  0 - 0.9   POC MID % 6.6  0 - 12 %M   POC Granulocyte 10.1 (*) 2 - 6.9   Granulocyte percent 70.3  37 - 80 %G   RBC 4.67  4.04 - 5.48 M/uL   Hemoglobin 12.9  12.2 - 16.2 g/dL   HCT, POC 78.4  69.6 - 47.9 %   MCV 88.2  80 -  97 fL   MCH, POC 27.6  27 - 31.2 pg   MCHC 31.3 (*) 31.8 - 35.4 g/dL   RDW, POC 29.5     Platelet Count, POC 379  142 - 424 K/uL   MPV 7.8  0 - 99.8 fL    assessment: Vasculitic dermatitis Mild leukocytosis  Plan: Tried to get out in with a rheumatologist if possible. I will try and speak to Dr. Nickola Major. She is to call me back. In the meanwhile placed her on prednisone.  Will get photos.  Spoke with the rheumatologist and she was very helpful. She agreed with a tapered dose of prednisone for a couple of weeks. The patient then will see her in early February as planned. The patient had this explained to her, and will contact me if problems sooner.

## 2012-09-08 ENCOUNTER — Ambulatory Visit (INDEPENDENT_AMBULATORY_CARE_PROVIDER_SITE_OTHER): Payer: 59 | Admitting: Internal Medicine

## 2012-09-08 VITALS — BP 117/79 | HR 97 | Temp 97.8°F | Resp 17 | Ht 65.0 in | Wt 282.0 lb

## 2012-09-08 DIAGNOSIS — M310 Hypersensitivity angiitis: Secondary | ICD-10-CM | POA: Insufficient documentation

## 2012-09-08 DIAGNOSIS — T148XXA Other injury of unspecified body region, initial encounter: Secondary | ICD-10-CM

## 2012-09-08 DIAGNOSIS — R319 Hematuria, unspecified: Secondary | ICD-10-CM

## 2012-09-08 DIAGNOSIS — I776 Arteritis, unspecified: Secondary | ICD-10-CM

## 2012-09-08 LAB — URINALYSIS, MICROSCOPIC ONLY: Crystals: NONE SEEN

## 2012-09-08 LAB — URINALYSIS, ROUTINE W REFLEX MICROSCOPIC
Bilirubin Urine: NEGATIVE
Glucose, UA: NEGATIVE mg/dL
Protein, ur: 30 mg/dL — AB
Specific Gravity, Urine: 1.014 (ref 1.005–1.030)

## 2012-09-08 LAB — POCT URINALYSIS DIPSTICK
Bilirubin, UA: NEGATIVE
Glucose, UA: NEGATIVE
Ketones, UA: NEGATIVE
Spec Grav, UA: 1.02

## 2012-09-08 LAB — POCT UA - MICROSCOPIC ONLY
Crystals, Ur, HPF, POC: NEGATIVE
Mucus, UA: NEGATIVE

## 2012-09-08 MED ORDER — MUPIROCIN 2 % EX OINT: TOPICAL_OINTMENT | Freq: Three times a day (TID) | CUTANEOUS | Status: DC

## 2012-09-08 NOTE — Patient Instructions (Signed)
Vasculitis Vasculitis is when your blood vessels are inflamed. There are many different blood vessels in the body, and vasculitis can affect any of them. This includes large (veins and arteries) and small (capillaries) vessels. With vasculitis,   Blood vessel walls can become thick.  Blood vessels can become narrow.  Blood vessels can become weak. Sometimes, it becomes so weak that the blood vessel bulges out like a balloon. This is called an aneurysm. Aneurysms are rare but can be life-threatening.  Scarring can occur.  Not enough blood can flow through the blood vessels. All of these things can damage many parts of the body, including the muscles, kidneys, lungs and brain. There are many types of vasculitis. Some types are short-term (acute), while others are long-term (chronic). Some types may go away without treatment, and others may need to be treated for a long time.  CAUSES  Vasculitis occurs when the body's immune system (which fights germs and disease) makes a mistake. It attacks its own blood vessels. This causes inflammation (the body's way of reacting to injury or infection).   Why this happens is usually not known. The condition is then called primary vasculitis.  Sometimes, something triggers the inflammation. This is called secondary vasculitis. Possible causes include:  Infections.  An immune system disease. Examples include lupus, rheumatoid arthritis and scleroderma.  An allergic reaction to a medicine.  Cancer that affects blood cells. This includes leukemia and lymphoma.  Males and females of all ages and races can develop vasculitis. Some risk factors make vasculitis more likely. These include:  Smoking.  Stress.  Physical injury. SYMPTOMS  There are more than 20 types of vasculitis. Symptoms of each type vary, but some symptoms are common.  Many people with vasculitis:  Have a fever.  Do not feel like eating.  Lose weight.  Feel very tired.  Have  aches and pains.  Feel weak.  Start to not have feeling (numbness) in an area.  Symptoms for some types of vasculitis also could be:  Sores in the mouth or eyes.  Skin problems. This could be sores, spots or rashes.  Trouble seeing.  Trouble breathing.  Blood in the urine.  Headaches.  Pain in the abdomen.  Stuffy or bloody nose. DIAGNOSIS  Vasculitis symptoms are similar to symptoms of many other conditions. That can make it hard to tell if you have vasculitis. To be sure, your caregiver will ask about your symptoms and do a physical exam. Certain tests may be necessary, such as:   A complete blood count (CBC). This test shows how many red blood cells are in your blood. Not having enough red blood cells (anemic) can result from vasculitis.  Erythrocyte sedimentation (also called sed rate test). It measures inflammation in the body.  C reactive protein (CRP). This also shows if there is inflammation.  Anti-neutrophil cytoplasmic antibodies (ANCA). This can tell if the immune system is reacting to certain cells in the blood.  A urine test. This checks for blood or protein in the urine. That could be a sign of kidney damage from vasculitis.  Imaging tests. These tests create pictures from inside the body. Options include:  X-rays.  Computed tomography (CT) scan. This uses X-rays guided by a computer.  Ultrasound. It creates an image using sound waves.  Magnetic resonance imaging (MRI). It uses radio waves, magnets and a computer.  Angiography. A dye is put into your blood vessels. Then, an X-ray is taken of them.  A biopsy of   a blood vessel. This means your caregiver will take out a small piece of a blood vessel. Then, it is checked under a microscope. This is an important test. It often is the best way to know for sure if you have vasculitis. TREATMENT   Treatment will depend on the type of vasculitis and how severe it is. Often, you will need to see a specialist in  immunologic diseases (rheumatologist).  Some types of vasculitis may go away without treatment.  Some types need only over-the-counter drugs.  Prescription medicines are used to treat many types of vasculitis. For example:  Corticosteroids. These are the drugs used most often. They are very powerful. Usually, a high dose is taken until symptoms improve. Then, the dose is gradually decreased. Using corticosteroids for a long time can cause problems. They can make muscles and bones weak. They can cause blood pressure to go up, and cause diabetes. Also, people often gain weight when they take corticosteroids.  Cytotoxic drugs. These kill cells that cause inflammation. Sometimes, they are used if corticosteroids do not help. Other times, both medications are taken.  Surgery. This may be needed to repair a blood vessel that has bulged out (aneurysm).  Treatment can sometimes cure your disease. Other times, it can put the disease in remission (no symptoms). Increased treatment and reevaluation might be necessary if your disease comes back or flares. HOME CARE INSTRUCTIONS   Take any medications that your caregiver prescribes. Follow the directions carefully.  Watch for any problems that can be caused by a drug (side effects). Tell your caregiver right away if you notice any changes or problems.  Keep all appointments for checkups. This is important to help your caregiver watch for side effects. Checkups may include:  Periodic blood tests.  Bone density testing. This checks how strong or weak your bones are.  Blood pressure checks. If your blood pressure rises, you may need to take a drug to control it while you are taking corticosteroids.  Blood sugar checks. This is to be sure you are not developing diabetes. If you have diabetes, corticosteroid medications may make it worse and require increased treatment.  Exercise. First, talk with your caregiver about what would be OK for you to do.  Aerobic exercise (which increases your heart rate) is often suggested. It includes walking. This type of exercise is good because it helps prevent bone loss. It also helps control your blood pressure.  Follow a healthy diet. Include good sources of protein in your diet. Also include fruits, vegetables and whole grains. Your caregiver can refer you to an expert on healthy eating (dietitian) for more detailed advice.  Learn as much as you can about vasculitis. Understanding your condition can help you cope with it. Coping can be hard because this may be something you will have to live with for years.  Consider joining a support group. It often helps to talk about your worries with others who have the same problems.  Tell your caregiver if you feel stressed, anxious or depressed. Your caregiver may refer you to a specialist, or recommend medication to relieve your symptoms. SEEK MEDICAL CARE IF:   The symptoms that led to your diagnosis return.  You develop worsening fever, fatigue, headache, weight loss or pain in your jaw.  You develop signs of infection. Infections can be worse if you are on corticosteroid medication.  You develop any new or unexplained symptoms of disease. SEEK IMMEDIATE MEDICAL CARE IF:   Your eyesight changes.    Pain does not go away, even after taking medication.  You feel pain in your chest or abdomen.  You have trouble breathing.  One side of your face or body becomes suddenly weak or numb.  Your nose bleeds.  There is blood in your urine.  You develop a fever of more than 102 F (38.9 C). Document Released: 06/10/2009 Document Revised: 11/06/2011 Document Reviewed: 06/10/2009 [facility] Patient Information 2013 [place], [place].

## 2012-09-08 NOTE — Progress Notes (Signed)
Subjective:    Patient ID: Veronica Morris, female    DOB: 1966/03/31, 47 y.o.   MRN: [id number]  HPI Rash is spreading, has proven vasculitis cause to be determined. Also has hematuria and pyuria with no burning, urgency, abdominal pain--c and s done today No fever, fatigue or muscle pain Does have painful purpura and progressive rash, on prednisone taper. ANA weakly positive Review of Systems obesity    Objective:   Physical Exam  Vitals reviewed. Constitutional: She is oriented to person, place, and time. She appears well-nourished. No distress.  HENT:  Right Ear: External ear normal.  Left Ear: External ear normal.  Nose: Nose normal.  Mouth/Throat: Oropharynx is clear and moist.  Eyes: EOM are normal. Pupils are equal, round, and reactive to light.  Neck: Neck supple.  Cardiovascular: Normal rate, regular rhythm and normal heart sounds.   Pulmonary/Chest: Effort normal and breath sounds normal.  Abdominal: Soft. Bowel sounds are normal. There is no tenderness.  Musculoskeletal: Normal range of motion. She exhibits no edema and no tenderness.  Lymphadenopathy:    She has no cervical adenopathy.  Neurological: She is alert and oriented to person, place, and time. No cranial nerve deficit. She exhibits normal muscle tone. Coordination normal.  Skin: Purpura and rash noted. Rash is macular, papular, maculopapular and nodular. Rash is not pustular. There is erythema.          Vasculitic rash spreading areas shown  Psychiatric: She has a normal mood and affect. Her behavior is normal.     Results for orders placed in visit on 09/08/12  POCT URINALYSIS DIPSTICK      Component Value Range   Color, UA yellow     Clarity, UA clear     Glucose, UA neg     Bilirubin, UA neg     Ketones, UA neg     Spec Grav, UA 1.020     Blood, UA large     pH, UA 5.0     Protein, UA 100     Urobilinogen, UA 0.2     Nitrite, UA neg     Leukocytes, UA moderate (2+)    POCT UA -  MICROSCOPIC ONLY      Component Value Range   WBC, Ur, HPF, POC 8-12     RBC, urine, microscopic 10-12     Bacteria, U Microscopic trace     Mucus, UA neg     Epithelial cells, urine per micros 1-3     Crystals, Ur, HPF, POC neg     Casts, Ur, LPF, POC neg     Yeast, UA neg     Results for orders placed in visit on 09/08/12  POCT URINALYSIS DIPSTICK      Component Value Range   Color, UA yellow     Clarity, UA clear     Glucose, UA neg     Bilirubin, UA neg     Ketones, UA neg     Spec Grav, UA 1.020     Blood, UA large     pH, UA 5.0     Protein, UA 100     Urobilinogen, UA 0.2     Nitrite, UA neg     Leukocytes, UA moderate (2+)    POCT UA - MICROSCOPIC ONLY      Component Value Range   WBC, Ur, HPF, POC 8-12     RBC, urine, microscopic 10-12     Bacteria, U Microscopic trace  Mucus, UA neg     Epithelial cells, urine per micros 1-3     Crystals, Ur, HPF, POC neg     Casts, Ur, LPF, POC neg     Yeast, UA neg    POCT SEDIMENTATION RATE      Component Value Range   POCT SED RATE 53 (*) 0 - 22 mm/hr  URINALYSIS, ROUTINE W REFLEX MICROSCOPIC      Component Value Range   Color, Urine YELLOW  YELLOW   APPearance CLEAR  CLEAR   Specific Gravity, Urine 1.014  1.005 - 1.030   pH 5.0  5.0 - 8.0   Glucose, UA NEG  NEG mg/dL   Bilirubin Urine NEG  NEG   Ketones, ur NEG  NEG mg/dL   Hgb urine dipstick LARGE (*) NEG   Protein, ur 30 (*) NEG mg/dL   Urobilinogen, UA 0.2  0.0 - 1.0 mg/dL   Nitrite NEG  NEG   Leukocytes, UA MOD (*) NEG  URINALYSIS, MICROSCOPIC ONLY      Component Value Range   Squamous Epithelial / LPF RARE  RARE   Crystals NONE SEEN  NONE SEEN   Casts NONE SEEN  NONE SEEN   WBC, UA 11-20 (*) <3 WBC/hpf   RBC / HPF 0-2  <3 RBC/hpf   Bacteria, UA NONE SEEN  RARE    [facility] to [facility] look for casts/culture done. CPK and Aldolase  Repeat sed rate    Assessment & Plan:  Vasculitis spreading/No systemic symptoms./sed rate 53 Painful  purpura/hematuria and pyuria if culture positive needs txment On prednisone taper See Rheumatology in 2 days as scheduled/

## 2012-09-10 LAB — URINE CULTURE: Colony Count: 50000

## 2012-09-11 ENCOUNTER — Telehealth: Payer: Self-pay

## 2012-09-11 DIAGNOSIS — R319 Hematuria, unspecified: Secondary | ICD-10-CM

## 2012-09-11 DIAGNOSIS — R300 Dysuria: Secondary | ICD-10-CM

## 2012-09-11 NOTE — Telephone Encounter (Signed)
Dr. Perrin Maltese advised patient to get antibiotic from rheumotologist.  She got the message after seeing rheumotologist  Needs antibiotic   [facility] [address]   [phone]

## 2012-09-12 MED ORDER — AMOXICILLIN 875 MG PO TABS: 875.0000 mg | ORAL_TABLET | Freq: Two times a day (BID) | ORAL | Status: DC

## 2012-09-12 NOTE — Telephone Encounter (Signed)
Patient came in crying this morning saying she was worried if she does not get her antibotic please call asap

## 2012-09-12 NOTE — Telephone Encounter (Signed)
Thank you so much. I have advised patient.

## 2012-09-12 NOTE — Telephone Encounter (Signed)
Patient has already seen Dr Nickola Major and she advised we should treat this she did not get the urinalysis, when she got Dr Perrin Maltese message patient had already seen Dr Nickola Major, please advise, patients urine is as follows: her sed rate is 53.  Color, UA  yellow   Clarity, UA  clear   Glucose, UA  neg   Bilirubin, UA  neg   Ketones, UA  neg   Spec Grav, UA  1.020   Blood, UA  large   pH, UA  5.0   Protein, UA  100   Urobilinogen, UA  0.2   Nitrite, UA  neg   Leukocytes, UA  moderate (2+)   Resulting Agency  [facility]                          Notes Recorded by Bronson Curb, CMA on 09/11/2012 at 8:24 AM Mailed patient copy of labs Notes Recorded by Jonita Albee, MD on 09/10/2012 at 2:47 PM Labs abnormal. Send copy of lab work to patient..  Take results to Dr. Nickola Major        Component  Value    Colony Count  50,000 COLONIES/ML    Organism ID, Bacteria  GROUP B STREP (S.AGALACTIAE) ISOLATED    Comments:    Testing against S. agalactiae not routinely performed due to predictability of AMP/PEN/VAN susceptibility.    Resulting Agency  [facility]    Result Narrative

## 2012-09-12 NOTE — Telephone Encounter (Signed)
Culture grew Group B Strep.  Rx for Amoxicillin sent.  If her symptoms persist, RTC.  If she desires, she can also use an OTC uristat product.

## 2012-10-12 ENCOUNTER — Other Ambulatory Visit: Payer: Self-pay

## 2012-11-06 ENCOUNTER — Encounter (HOSPITAL_BASED_OUTPATIENT_CLINIC_OR_DEPARTMENT_OTHER): Payer: 59 | Attending: General Surgery

## 2012-11-06 DIAGNOSIS — I776 Arteritis, unspecified: Secondary | ICD-10-CM | POA: Insufficient documentation

## 2012-11-06 DIAGNOSIS — L97809 Non-pressure chronic ulcer of other part of unspecified lower leg with unspecified severity: Secondary | ICD-10-CM | POA: Insufficient documentation

## 2012-11-27 ENCOUNTER — Encounter (HOSPITAL_BASED_OUTPATIENT_CLINIC_OR_DEPARTMENT_OTHER): Payer: 59 | Attending: General Surgery

## 2012-11-27 DIAGNOSIS — I776 Arteritis, unspecified: Secondary | ICD-10-CM | POA: Insufficient documentation

## 2012-11-27 DIAGNOSIS — L97809 Non-pressure chronic ulcer of other part of unspecified lower leg with unspecified severity: Secondary | ICD-10-CM | POA: Insufficient documentation

## 2013-01-01 ENCOUNTER — Encounter (HOSPITAL_BASED_OUTPATIENT_CLINIC_OR_DEPARTMENT_OTHER): Payer: 59 | Attending: General Surgery

## 2013-01-01 DIAGNOSIS — L97809 Non-pressure chronic ulcer of other part of unspecified lower leg with unspecified severity: Secondary | ICD-10-CM | POA: Insufficient documentation

## 2013-01-01 DIAGNOSIS — I776 Arteritis, unspecified: Secondary | ICD-10-CM | POA: Insufficient documentation

## 2013-07-03 ENCOUNTER — Other Ambulatory Visit: Payer: Self-pay

## 2015-04-19 ENCOUNTER — Ambulatory Visit (INDEPENDENT_AMBULATORY_CARE_PROVIDER_SITE_OTHER): Payer: Managed Care, Other (non HMO) | Admitting: Physician Assistant

## 2015-04-19 VITALS — BP 124/70 | HR 97 | Temp 98.0°F | Resp 18 | Ht 64.0 in | Wt 298.5 lb

## 2015-04-19 DIAGNOSIS — H6982 Other specified disorders of Eustachian tube, left ear: Secondary | ICD-10-CM

## 2015-04-19 DIAGNOSIS — J309 Allergic rhinitis, unspecified: Secondary | ICD-10-CM

## 2015-04-19 MED ORDER — FLUTICASONE PROPIONATE 50 MCG/ACT NA SUSP: 2.0000 | Freq: Every day | NASAL | Status: DC

## 2015-04-19 NOTE — Patient Instructions (Signed)
Take zyrtec-D at night for 2 weeks. Use flonase in the morning daily for 2-4 weeks. Return if symptoms are not improving after 4 weeks. Return if at any time if you develop fever or chills.

## 2015-04-19 NOTE — Progress Notes (Signed)
[facility] [address] [place] [address] [phone]  Date:  04/19/2015   Name:  Veronica Morris   DOB:  02-Apr-1966   MRN:  [id number]  PCP:  Tally Due, MD    Chief Complaint: Ear Pain   History of Present Illness:  This is a 49 y.o. female who is presenting with left otalgia and nasal congestion x 1 week. Pain has been intermittent. It initially resolved for a few days. [place] and noticed her ear was popping on the way down the mountain. Pain has been more persistent since. She reports she experiences a sharp pain in her left ear when she swallows. Had a sore throat initially but this has resolved. She has a dry cough. Her eyes have been watering. She denies fever or chills. Took [employer] zyrtec a couple times and is unsure if it helped. She has tried nyquil which has helped her sleep. She has a history of allergic rhinitis. This feels like typical allergies to her except for the ear pain. She has tried flonase a few years ago, can't remember if it helped.  Review of Systems:  Review of Systems See HPI  Patient Active Problem List   Diagnosis Date Noted  . 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 Wound, open, foot 02/12/2012    Prior to Admission medications   Medication Sig Start Date End Date Taking? Authorizing Provider  cetirizine (ZYRTEC) 10 MG tablet Take 10 mg by mouth daily.   Yes Historical Provider, MD  diphenhydrAMINE (BENADRYL) 25 MG tablet Take 25 mg by mouth every 6 (six) hours as needed.   Yes Historical Provider, MD       Ethelda Chick, MD    No Known Allergies  Past Surgical History  Procedure Laterality Date  . Cholecystectomy      Social History  Substance Use Topics  . Smoking status: Never Smoker   . Smokeless tobacco: None  . Alcohol Use: No    Family History  Problem Relation Age of Onset  . Fibromyalgia Mother   . Restless legs syndrome Mother   . Arthritis Father     Medication  list has been reviewed and updated.  Physical Examination:  Physical Exam  Constitutional: She is oriented to person, place, and time. She appears well-developed and well-nourished. No distress.  HENT:  Head: Normocephalic and atraumatic.  Right Ear: Hearing, external ear and ear canal normal. A middle ear effusion is present.  Left Ear: Hearing, external ear and ear canal normal. A middle ear effusion is present.  Nose: Rhinorrhea present. Right sinus exhibits no maxillary sinus tenderness and no frontal sinus tenderness. Left sinus exhibits no maxillary sinus tenderness and no frontal sinus tenderness.  Mouth/Throat: Uvula is midline and mucous membranes are normal. Posterior oropharyngeal erythema present. No oropharyngeal exudate or posterior oropharyngeal edema.  Eyes: Conjunctivae and lids are normal. Right eye exhibits no discharge. Left eye exhibits no discharge. No scleral icterus.  Cardiovascular: Normal rate, regular rhythm, normal heart sounds and normal pulses.   No murmur heard. Pulmonary/Chest: Effort normal and breath sounds normal. No respiratory distress. She has no wheezes. She has no rhonchi. She has no rales.  Musculoskeletal: Normal range of motion.  Lymphadenopathy:       Head (right side): No submental, no submandibular and no tonsillar adenopathy present.       Head (left side): No submental, no submandibular and no tonsillar adenopathy present.  She has no cervical adenopathy.  Neurological: She is alert and oriented to person, place, and time.  Skin: Skin is warm, dry and intact. No lesion and no rash noted.  Psychiatric: She has a normal mood and affect. Her speech is normal and behavior is normal. Thought content normal.   BP 124/70 mmHg  Pulse 97  Temp(Src) 98 F (36.7 C) (Oral)  Resp 18  Ht  (1.626 m)  Wt 298 lb 8 oz (135.399 kg)  BMI 51.21 kg/m2  SpO2 99%  Assessment and Plan:  1. Allergic rhinitis, unspecified allergic rhinitis type 2.  Eustachian tube dysfunction, left Symptoms likely allergic, ear pain likely ETD as evidenced by bilateral middle ear effusions. She will take zyrtec-D daily for next 1-2 weeks and flonase daily as long as having problems with allergies. Return if symptoms not improved in 4 weeks or at any time if sx worsen. - fluticasone (FLONASE) 50 MCG/ACT nasal spray; Place 2 sprays into both nostrils daily.  Dispense: 16 g; Refill: 6   Easten Maceachern V. Dyke Brackett, MHS [facility]  04/20/2015

## 2015-04-20 DIAGNOSIS — J309 Allergic rhinitis, unspecified: Secondary | ICD-10-CM | POA: Insufficient documentation

## 2016-04-29 ENCOUNTER — Ambulatory Visit (INDEPENDENT_AMBULATORY_CARE_PROVIDER_SITE_OTHER): Payer: Managed Care, Other (non HMO)

## 2016-04-29 ENCOUNTER — Ambulatory Visit (INDEPENDENT_AMBULATORY_CARE_PROVIDER_SITE_OTHER): Payer: Managed Care, Other (non HMO) | Admitting: Urgent Care

## 2016-04-29 VITALS — BP 146/74 | HR 120 | Temp 98.6°F | Resp 16 | Ht 64.0 in | Wt 283.0 lb

## 2016-04-29 DIAGNOSIS — M25562 Pain in left knee: Secondary | ICD-10-CM | POA: Diagnosis not present

## 2016-04-29 DIAGNOSIS — E669 Obesity, unspecified: Secondary | ICD-10-CM | POA: Diagnosis not present

## 2016-04-29 DIAGNOSIS — M170 Bilateral primary osteoarthritis of knee: Secondary | ICD-10-CM

## 2016-04-29 DIAGNOSIS — R300 Dysuria: Secondary | ICD-10-CM

## 2016-04-29 DIAGNOSIS — M25561 Pain in right knee: Secondary | ICD-10-CM

## 2016-04-29 LAB — POCT URINALYSIS DIP (MANUAL ENTRY)
BILIRUBIN UA: NEGATIVE
Bilirubin, UA: NEGATIVE
Glucose, UA: NEGATIVE
Nitrite, UA: NEGATIVE
PH UA: 5
PROTEIN UA: NEGATIVE
Spec Grav, UA: 1.02
UROBILINOGEN UA: 0.2

## 2016-04-29 LAB — POC MICROSCOPIC URINALYSIS (UMFC): Mucus: ABSENT

## 2016-04-29 MED ORDER — TRAMADOL HCL 50 MG PO TABS
50.0000 mg | ORAL_TABLET | Freq: Three times a day (TID) | ORAL | 0 refills | Status: DC | PRN
Start: 2016-04-29 — End: 2016-08-26

## 2016-04-29 MED ORDER — NITROFURANTOIN MONOHYD MACRO 100 MG PO CAPS: 100.0000 mg | ORAL_CAPSULE | Freq: Two times a day (BID) | ORAL | 0 refills | Status: DC

## 2016-04-29 NOTE — Progress Notes (Addendum)
MRN: [id number] DOB: [birth year]  Subjective:   Veronica Morris is a 50 y.o. female presenting for chief complaint of Dysuria (pain with urination x 3 days ) and Knee Pain (bilateral)  Dysuria - Reports 3 day history of dysuria, hematuria and loose stools. Has tried increase hydration. Denies fever, urinary frequency, urinary urgency, flank pain, abdominal pain, pelvic pain, cloudy malordorous urine, genital rash and vaginal discharge, nausea and vomiting.   Knee pain - Reports several year history of intermittent bilateral knee pain. Patient reports worsening knee pain in past week. Pain is achy, sharp, rated 3/10 but worsens at night. She also has achy knee pain first thing in the morning that lets up throughout the day. Admits that she has a sedentary work style for the most part. Has not tried medications consistently for her knee pain. She is worried that she may have arthritis due her strong family history of the same, would like to get x-rays for this.  Veronica Morris has a current medication list which includes the following prescription(s): cetirizine and diphenhydramine. Patient has No Known Allergies.  Veronica Morris  has a past medical history of Concussion. Also  has a past surgical history that includes Cholecystectomy.  Objective:   Vitals: BP (!) 146/74 (BP Location: Right Arm, Patient Position: Sitting, Cuff Size: Normal)   Pulse (!) 120   Temp 98.6 F (37 C) (Oral)   Resp 16   Ht 5\' 4"  (1.626 m)   Wt 283 lb (128.4 kg)   SpO2 96%   BMI 48.58 kg/m   Pulse was 92 on recheck by PA-Genell Thede.  Physical Exam  Constitutional: She is oriented to person, place, and time. She appears well-developed and well-nourished.  Cardiovascular: Normal rate, regular rhythm and intact distal pulses.  Exam reveals no gallop and no friction rub.   No murmur heard. Pulmonary/Chest: No respiratory distress. She has no wheezes. She has no rales.  Abdominal: Soft. Bowel sounds are normal. She exhibits no  distension and no mass. There is no tenderness.  No CVA tenderness.  Musculoskeletal:       Right knee: She exhibits normal range of motion, no swelling, no effusion, no ecchymosis, no deformity, no laceration, no erythema, normal alignment and normal patellar mobility. No tenderness found.       Left knee: She exhibits normal range of motion, no swelling, no effusion, no ecchymosis, no deformity, no laceration, no erythema, normal alignment and normal patellar mobility. Tenderness found. Medial joint line (mild) tenderness noted. No lateral joint line and no patellar tendon tenderness noted.  Neurological: She is alert and oriented to person, place, and time. She has normal reflexes.  Skin: Skin is warm and dry.   Results for orders placed or performed in visit on 04/29/16 (from the past 24 hour(s))  POCT urinalysis dipstick     Status: Abnormal   Collection Time: 04/29/16  9:56 AM  Result Value Ref Range   Color, UA yellow yellow   Clarity, UA clear clear   Glucose, UA negative negative   Bilirubin, UA negative negative   Ketones, POC UA negative negative   Spec Grav, UA 1.020    Blood, UA moderate (A) negative   pH, UA 5.0    Protein Ur, POC negative negative   Urobilinogen, UA 0.2    Nitrite, UA Negative Negative   Leukocytes, UA small (1+) (A) Negative  POCT Microscopic Urinalysis ([facility])     Status: Abnormal   Collection Time: 04/29/16 10:03 AM  Result  Value Ref Range   WBC,UR,HPF,POC Few (A) None WBC/hpf   RBC,UR,HPF,POC Few (A) None RBC/hpf   Bacteria None None, Too numerous to count   Mucus Absent Absent   Epithelial Cells, UR Per Microscopy Moderate (A) None, Too numerous to count cells/hpf   Dg Knee Complete 4 Views Left  Result Date: 04/29/2016 CLINICAL DATA:  Left knee pain. EXAM: LEFT KNEE - COMPLETE 4+ VIEW COMPARISON:  None. FINDINGS: Moderate tricompartmental degenerative changes with joint space narrowing and osteophytic spurring. No acute fracture or osteochondral  lesion. No definite joint effusion. IMPRESSION: Moderate tricompartmental degenerative changes. Electronically Signed   By: Rudie MeyerP.  Gallerani M.D.   On: 04/29/2016 10:47   Dg Knee Complete 4 Views Right  Result Date: 04/29/2016 CLINICAL DATA:  Bilateral knee pain. EXAM: RIGHT KNEE - COMPLETE 4+ VIEW COMPARISON:  None. FINDINGS: Moderate tricompartmental degenerative changes with joint space narrowing and osteophytic spurring. No osteochondral lesion or acute fracture. Small joint effusion. IMPRESSION: Moderate tricompartmental degenerative changes and small joint effusion. Electronically Signed   By: Rudie MeyerP.  Gallerani M.D.   On: 04/29/2016 10:47     Assessment and Plan :   1. Dysuria - Will cover for cystitis with Macrobid. Urine culture is pending. Patient is to start aggressive hydration. RTC in 3 days if no improvement.  2. Bilateral knee pain 3. Primary osteoarthritis of both knees 4. Obesity - Counseled on diagnosis, start APAP. Use Tramadol for breakthrough pain. Patient will let me know if her pain persists. Consider referral to ortho, PT, steroid course. I did recommend patient start knee friendly exercise as her weight may be a source of her knee arthritis together with her family history. She verbalized understanding.  Wallis BambergMario Aquila Menzie, PA-C [facility] [phone] 04/29/2016 10:09 AM

## 2016-04-29 NOTE — Patient Instructions (Addendum)
Tylenol You may take 500mg  every 6 hours or 750mg  every 8 hours for pain and inflammation of your knees.  Knee Pain Knee pain is a very common symptom and can have many causes. Knee pain often goes away when you follow your health care provider's instructions for relieving pain and discomfort at home. However, knee pain can develop into a condition that needs treatment. Some conditions may include:  Arthritis caused by wear and tear (osteoarthritis).  Arthritis caused by swelling and irritation (rheumatoid arthritis or gout).  A cyst or growth in your knee.  An infection in your knee joint.  An injury that will not heal.  Damage, swelling, or irritation of the tissues that support your knee (torn ligaments or tendinitis). If your knee pain continues, additional tests may be ordered to diagnose your condition. Tests may include X-rays or other imaging studies of your knee. You may also need to have fluid removed from your knee. Treatment for ongoing knee pain depends on the cause, but treatment may include:  Medicines to relieve pain or swelling.  Steroid injections in your knee.  Physical therapy.  Surgery. HOME CARE INSTRUCTIONS  Take medicines only as directed by your health care provider.  Rest your knee and keep it raised (elevated) while you are resting.  Do not do things that cause or worsen pain.  Avoid high-impact activities or exercises, such as running, jumping rope, or doing jumping jacks.  Apply ice to the knee area:  Put ice in a plastic bag.  Place a towel between your skin and the bag.  Leave the ice on for 20 minutes, 2-3 times a day.  Ask your health care provider if you should wear an elastic knee support.  Keep a pillow under your knee when you sleep.  Lose weight if you are overweight. Extra weight can put pressure on your knee.  Do not use any tobacco products, including cigarettes, chewing tobacco, or electronic cigarettes. If you need help  quitting, ask your health care provider. Smoking may slow the healing of any bone and joint problems that you may have. SEEK MEDICAL CARE IF:  Your knee pain continues, changes, or gets worse.  You have a fever along with knee pain.  Your knee buckles or locks up.  Your knee becomes more swollen. SEEK IMMEDIATE MEDICAL CARE IF:   Your knee joint feels hot to the touch.  You have chest pain or trouble breathing.   This information is not intended to replace advice given to you by your health care provider. Make sure you discuss any questions you have with your health care provider.   Document Released: 06/11/2007 Document Revised: 09/04/2014 Document Reviewed: 03/30/2014 Elsevier Interactive Patient Education 2016 Elsevier Inc.      Urinary Tract Infection Urinary tract infections (UTIs) can develop anywhere along your urinary tract. Your urinary tract is your body's drainage system for removing wastes and extra water. Your urinary tract includes two kidneys, two ureters, a bladder, and a urethra. Your kidneys are a pair of bean-shaped organs. Each kidney is about the size of your fist. They are located below your ribs, one on each side of your spine. CAUSES Infections are caused by microbes, which are microscopic organisms, including fungi, viruses, and bacteria. These organisms are so small that they can only be seen through a microscope. Bacteria are the microbes that most commonly cause UTIs. SYMPTOMS  Symptoms of UTIs may vary by age and gender of the patient and by the location  of the infection. Symptoms in young women typically include a frequent and intense urge to urinate and a painful, burning feeling in the bladder or urethra during urination. Older women and men are more likely to be tired, shaky, and weak and have muscle aches and abdominal pain. A fever may mean the infection is in your kidneys. Other symptoms of a kidney infection include pain in your back or sides below  the ribs, nausea, and vomiting. DIAGNOSIS To diagnose a UTI, your caregiver will ask you about your symptoms. Your caregiver will also ask you to provide a urine sample. The urine sample will be tested for bacteria and white blood cells. White blood cells are made by your body to help fight infection. TREATMENT  Typically, UTIs can be treated with medication. Because most UTIs are caused by a bacterial infection, they usually can be treated with the use of antibiotics. The choice of antibiotic and length of treatment depend on your symptoms and the type of bacteria causing your infection. HOME CARE INSTRUCTIONS  If you were prescribed antibiotics, take them exactly as your caregiver instructs you. Finish the medication even if you feel better after you have only taken some of the medication.  Drink enough water and fluids to keep your urine clear or pale yellow.  Avoid caffeine, tea, and carbonated beverages. They tend to irritate your bladder.  Empty your bladder often. Avoid holding urine for long periods of time.  Empty your bladder before and after sexual intercourse.  After a bowel movement, women should cleanse from front to back. Use each tissue only once. SEEK MEDICAL CARE IF:   You have back pain.  You develop a fever.  Your symptoms do not begin to resolve within 3 days. SEEK IMMEDIATE MEDICAL CARE IF:   You have severe back pain or lower abdominal pain.  You develop chills.  You have nausea or vomiting.  You have continued burning or discomfort with urination. MAKE SURE YOU:   Understand these instructions.  Will watch your condition.  Will get help right away if you are not doing well or get worse.   This information is not intended to replace advice given to you by your health care provider. Make sure you discuss any questions you have with your health care provider.   Document Released: 05/24/2005 Document Revised: 05/05/2015 Document Reviewed:  09/22/2011 Elsevier Interactive Patient Education [employer]

## 2016-04-30 LAB — URINE CULTURE: ORGANISM ID, BACTERIA: NO GROWTH

## 2016-05-04 ENCOUNTER — Encounter: Payer: Self-pay | Admitting: Urgent Care

## 2016-08-26 ENCOUNTER — Ambulatory Visit (INDEPENDENT_AMBULATORY_CARE_PROVIDER_SITE_OTHER): Payer: Managed Care, Other (non HMO) | Admitting: Family Medicine

## 2016-08-26 VITALS — BP 124/80 | HR 105 | Temp 98.0°F | Resp 17 | Ht 64.5 in | Wt 296.0 lb

## 2016-08-26 DIAGNOSIS — R809 Proteinuria, unspecified: Secondary | ICD-10-CM

## 2016-08-26 DIAGNOSIS — R3129 Other microscopic hematuria: Secondary | ICD-10-CM

## 2016-08-26 DIAGNOSIS — M310 Hypersensitivity angiitis: Secondary | ICD-10-CM

## 2016-08-26 LAB — POCT URINALYSIS DIP (MANUAL ENTRY)
BILIRUBIN UA: NEGATIVE
GLUCOSE UA: NEGATIVE
Ketones, POC UA: NEGATIVE
NITRITE UA: NEGATIVE
Protein Ur, POC: NEGATIVE
Spec Grav, UA: 1.02
UROBILINOGEN UA: 0.2
pH, UA: 5.5

## 2016-08-26 LAB — POC MICROSCOPIC URINALYSIS (UMFC)

## 2016-08-26 LAB — POCT SEDIMENTATION RATE: POCT SED RATE: 45 mm/hr — AB (ref 0–22)

## 2016-08-26 MED ORDER — PREDNISONE 10 MG PO TABS: ORAL_TABLET | ORAL | 0 refills | Status: DC

## 2016-08-26 NOTE — Progress Notes (Signed)
Subjective:  By signing my name below, I, [name], attest that this documentation has been prepared under the direction and in the presence of [name], MD. Electronically Signed: [name], [place]. 08/26/2016 , 8:36 AM .  Patient was seen in Room 12 .   Patient ID: Veronica Morris, female    DOB: 11/17/1965, 50 y.o.   MRN: [id number] Chief Complaint  Patient presents with  . Rash    on feet and legs    HPI Veronica Morris is a 50 y.o. female who presents to [facility] complaining of rash over her lower extremities. Patient states she had similar issues initially in the winter of 2013. At that time, she went to [facility] and was given medication to treat for mites. However, her symptoms worsened and spread up into her upper extremities. Afterwards, she came here and followed up with her dermatologist. She was diagnosed with leukocytoclastic vasculitis, treated with prednisone. She notes having a short burst of similar rash again in winter 2015, and again cleared with prednisone.   Patient reports symptoms returning about 4 days ago in her feet with some pain. The rash has started to spread into her calves. She has set up an appointment with Dr. Trudie Reed but would need to wait until Jan 24th to be seen. She has brought in records of her previous visits with her today.   Her nephrologist is Dr. Neta Ehlers at [facility]. She stopped losartan in 2016. When her leukocytoclastic vasculitis, all other autoimmune labs were held, except HLA positive. Even under treatment of long term prednisone, her inflammatory labs stayed mildly elevated, with sed rate ranging 30~50, and CRP at 12~15.   Past Medical History:  Diagnosis Date  . Concussion    Prior to Admission medications   Medication Sig Start Date End Date Taking? Authorizing Provider  cetirizine (ZYRTEC) 10 MG tablet Take 10 mg by mouth daily.   Yes Historical Provider, MD  diphenhydrAMINE (BENADRYL) 25 MG tablet Take 25 mg by mouth every  6 (six) hours as needed.    Historical Provider, MD  nitrofurantoin, macrocrystal-monohydrate, (MACROBID) 100 MG capsule Take 1 capsule (100 mg total) by mouth 2 (two) times daily. Patient not taking: Reported on 08/26/2016 04/29/16   [name], PA-C  traMADol (ULTRAM) 50 MG tablet Take 1 tablet (50 mg total) by mouth every 8 (eight) hours as needed. Patient not taking: Reported on 08/26/2016 04/29/16   [name], PA-C   No Known Allergies   Review of Systems  Constitutional: Negative for chills, fatigue, fever and unexpected weight change.  Respiratory: Negative for cough.   Gastrointestinal: Negative for constipation, diarrhea, nausea and vomiting.  Skin: Positive for rash.  Neurological: Negative for dizziness, weakness and headaches.       Objective:   Physical Exam  Constitutional: She is oriented to person, place, and time. She appears well-developed and well-nourished. No distress.  HENT:  Head: Normocephalic and atraumatic.  Eyes: EOM are normal. Pupils are equal, round, and reactive to light.  Neck: Neck supple.  Cardiovascular: Normal rate.   Pulmonary/Chest: Effort normal. No respiratory distress.  Musculoskeletal: Normal range of motion.  Neurological: She is alert and oriented to person, place, and time.  Skin: Skin is warm and dry.  petechial papules widely spread over lower extremities  Psychiatric: She has a normal mood and affect. Her behavior is normal.  Nursing note and vitals reviewed.   BP 124/80 (BP Location: Right Arm, Patient Position: Sitting, Cuff Size: Normal)  Pulse (!) 105   Temp 98 F (36.7 C) (Oral)   Resp 17   Ht 5' 4.5" (1.638 m)   Wt 296 lb (134.3 kg)   LMP 08/02/2016 (Approximate)   SpO2 97%   BMI 50.02 kg/m      Results for orders placed or performed in visit on 08/26/16  C-reactive protein  Result Value Ref Range   CRP WILL FOLLOW   CBC with Differential/Platelet  Result Value Ref Range   WBC 9.6 3.4 - 10.8 x10E3/uL   RBC 4.34  3.77 - 5.28 x10E6/uL   Hemoglobin 12.3 11.1 - 15.9 g/dL   Hematocrit 38.4 34.0 - 46.6 %   MCV 89 79 - 97 fL   MCH 28.3 26.6 - 33.0 pg   MCHC 32.0 31.5 - 35.7 g/dL   RDW 13.6 12.3 - 15.4 %   Platelets 305 150 - 379 x10E3/uL   Neutrophils 65 Not Estab. %   Lymphs 23 Not Estab. %   Monocytes 7 Not Estab. %   Eos 5 Not Estab. %   Basos 0 Not Estab. %   Neutrophils Absolute 6.3 1.4 - 7.0 x10E3/uL   Lymphocytes Absolute 2.2 0.7 - 3.1 x10E3/uL   Monocytes Absolute 0.6 0.1 - 0.9 x10E3/uL   EOS (ABSOLUTE) 0.4 0.0 - 0.4 x10E3/uL   Basophils Absolute 0.0 0.0 - 0.2 x10E3/uL   Immature Granulocytes 0 Not Estab. %   Immature Grans (Abs) 0.0 0.0 - 0.1 x10E3/uL  Comprehensive metabolic panel  Result Value Ref Range   Glucose 96 65 - 99 mg/dL   BUN 11 6 - 24 mg/dL   Creatinine, Ser 0.59 0.57 - 1.00 mg/dL   GFR calc non Af Amer 109 >59 mL/min/1.73   GFR calc Af Amer 125 >59 mL/min/1.73   BUN/Creatinine Ratio 19 9 - 23   Sodium 140 134 - 144 mmol/L   Potassium 4.0 3.5 - 5.2 mmol/L   Chloride 102 96 - 106 mmol/L   CO2 23 18 - 29 mmol/L   Calcium 8.7 8.7 - 10.2 mg/dL   Total Protein 7.0 6.0 - 8.5 g/dL   Albumin 4.1 3.5 - 5.5 g/dL   Globulin, Total 2.9 1.5 - 4.5 g/dL   Albumin/Globulin Ratio 1.4 1.2 - 2.2   Bilirubin Total 0.4 0.0 - 1.2 mg/dL   Alkaline Phosphatase 87 39 - 117 IU/L   AST 13 0 - 40 IU/L   ALT 10 0 - 32 IU/L  POCT urinalysis dipstick  Result Value Ref Range   Color, UA yellow yellow   Clarity, UA cloudy (A) clear   Glucose, UA negative negative   Bilirubin, UA negative negative   Ketones, POC UA negative negative   Spec Grav, UA 1.020    Blood, UA moderate (A) negative   pH, UA 5.5    Protein Ur, POC negative negative   Urobilinogen, UA 0.2    Nitrite, UA Negative Negative   Leukocytes, UA small (1+) (A) Negative  POCT Microscopic Urinalysis ([facility])  Result Value Ref Range   WBC,UR,HPF,POC Too numerous to count  (A) None WBC/hpf   RBC,UR,HPF,POC Moderate (A) None  RBC/hpf   Bacteria Few (A) None, Too numerous to count   Mucus Present (A) Absent   Epithelial Cells, UR Per Microscopy Moderate (A) None, Too numerous to count cells/hpf  POCT SEDIMENTATION RATE  Result Value Ref Range   POCT SED RATE 45 (A) 0 - 22 mm/hr    Assessment & Plan:   Treat with prednisone  0.7m/kg until new lesions are no longer forming, which is usually 1~2 weeks, and then taper over 3~6 weeks. Improvement might not be noted until 1 week after prednisone. Could transition to NSAIDs after prednisone. Call in hydroxyzine if pruritic.   1. Leukocytoclastic vasculitis ([place]   2. Proteinuria, unspecified type   3.       Microscopic hematuria - following with [facility] Dr. NNeta Ehlers- may want to restart losartan if sxs persist  Orders Placed This Encounter  Procedures  . C-reactive protein  . CBC with Differential/Platelet  . Comprehensive metabolic panel  . POCT urinalysis dipstick  . POCT Microscopic Urinalysis ([facility])  . POCT SEDIMENTATION RATE    Meds ordered this encounter  Medications  . predniSONE (DELTASONE) 10 MG tablet    Sig: Take 4 tabs po qd x 2 wks, then 3 tabs po qd x 1 wk, 2 tabs po qd x 1 wk, 1 tab po qd x 1 wk, 0.5 tabs po qd x 1 wk    Dispense:  102 tablet    Refill:  0    I personally performed the services described in this documentation, which was scribed in my presence. The recorded information has been reviewed and considered, and addended by me as needed.   EDelman Cheadle M.D.  [facility]the[address] [place] [phone]  08/27/16 3:34 PM

## 2016-08-26 NOTE — Patient Instructions (Addendum)
You may not notice any improvement in your symptoms for a week after starting the prednisone. You should start decreasing her dose of prednisone in 1 week as long as you are not noticing any new lesions. If you complete the course of prednisone you should transition to daily Aleve. You may use Benadryl Zyrtec or Claritin as needed for itching. The best thing to do would be to elevate your legs whenever possible, especially while sitting at a desk. Try wearing compression socks during the day. You can find these on [employer] for reasonable prices - check out SockWell or go2 compression. You would want to start off with a medium grade at approximately 15 mmHg to the knee.      IF you received an x-ray today, you will receive an invoice from [facility]. Please contact [facility] at [phone] with questions or concerns regarding your invoice.   IF you received labwork today, you will receive an invoice from [facility]. Please contact [facility] at [phone] with questions or concerns regarding your invoice.   Our billing staff will not be able to assist you with questions regarding bills from these companies.  You will be contacted with the lab results as soon as they are available. The fastest way to get your results is to activate your My Chart account. Instructions are located on the last page of this paperwork. If you have not heard from us regarding the results in 2 weeks, please contact this office.      Peripheral Edema Peripheral edema is swelling that is caused by a buildup of fluid. Peripheral edema most often affects the lower legs, ankles, and feet. It can also develop in the arms, hands, and face. The area of the body that has peripheral edema will look swollen. It may also feel heavy or warm. Your clothes may start to feel tight. Pressing on the area may make a temporary dent in your skin. You may not be able to move your arm or leg as much as usual. There are many  causes of peripheral edema. It can be a complication of other diseases, such as congestive heart failure, kidney disease, or a problem with your blood circulation. It also can be a side effect of certain medicines. It often happens to women during pregnancy. Sometimes, the cause is not known. Treating the underlying condition is often the only treatment for peripheral edema. Follow these instructions at home: Pay attention to any changes in your symptoms. Take these actions to help with your discomfort:  Raise (elevate) your legs while you are sitting or lying down.  Move around often to prevent stiffness and to lessen swelling. Do not sit or stand for long periods of time.  Wear support stockings as told by your health care provider.  Follow instructions from your health care provider about limiting salt (sodium) in your diet. Sometimes eating less salt can reduce swelling.  Take over-the-counter and prescription medicines only as told by your health care provider. Your health care provider may prescribe medicine to help your body get rid of excess water (diuretic).  Keep all follow-up visits as told by your health care provider. This is important. Contact a health care provider if:  You have a fever.  Your edema starts suddenly or is getting worse, especially if you are pregnant or have a medical condition.  You have swelling in only one leg.  You have increased swelling and pain in your legs. Get help right away if:  You develop  shortness of breath, especially when you are lying down.  You have pain in your chest or abdomen.  You feel weak.  You faint. This information is not intended to replace advice given to you by your health care provider. Make sure you discuss any questions you have with your health care provider. Document Released: 09/21/2004 Document Revised: 01/17/2016 Document Reviewed: 02/24/2015 Elsevier Interactive Patient Education  2017 [employer]

## 2016-08-27 ENCOUNTER — Encounter: Payer: Self-pay | Admitting: Family Medicine

## 2016-08-27 DIAGNOSIS — R3129 Other microscopic hematuria: Secondary | ICD-10-CM | POA: Insufficient documentation

## 2016-08-27 LAB — COMPREHENSIVE METABOLIC PANEL
ALT: 10 IU/L (ref 0–32)
AST: 13 IU/L (ref 0–40)
Albumin/Globulin Ratio: 1.4 (ref 1.2–2.2)
Albumin: 4.1 g/dL (ref 3.5–5.5)
Alkaline Phosphatase: 87 IU/L (ref 39–117)
BUN/Creatinine Ratio: 19 (ref 9–23)
BUN: 11 mg/dL (ref 6–24)
Bilirubin Total: 0.4 mg/dL (ref 0.0–1.2)
CALCIUM: 8.7 mg/dL (ref 8.7–10.2)
CO2: 23 mmol/L (ref 18–29)
CREATININE: 0.59 mg/dL (ref 0.57–1.00)
Chloride: 102 mmol/L (ref 96–106)
GFR, EST AFRICAN AMERICAN: 125 mL/min/{1.73_m2} (ref >59)
GFR, EST NON AFRICAN AMERICAN: 109 mL/min/{1.73_m2} (ref >59)
GLOBULIN, TOTAL: 2.9 g/dL (ref 1.5–4.5)
Glucose: 96 mg/dL (ref 65–99)
POTASSIUM: 4 mmol/L (ref 3.5–5.2)
SODIUM: 140 mmol/L (ref 134–144)
TOTAL PROTEIN: 7 g/dL (ref 6.0–8.5)

## 2016-08-27 LAB — CBC WITH DIFFERENTIAL/PLATELET
BASOS: 0 %
Basophils Absolute: 0 10*3/uL (ref 0.0–0.2)
EOS (ABSOLUTE): 0.4 10*3/uL (ref 0.0–0.4)
EOS: 5 %
HEMATOCRIT: 38.4 % (ref 34.0–46.6)
HEMOGLOBIN: 12.3 g/dL (ref 11.1–15.9)
IMMATURE GRANS (ABS): 0 10*3/uL (ref 0.0–0.1)
IMMATURE GRANULOCYTES: 0 %
LYMPHS: 23 %
Lymphocytes Absolute: 2.2 10*3/uL (ref 0.7–3.1)
MCH: 28.3 pg (ref 26.6–33.0)
MCHC: 32 g/dL (ref 31.5–35.7)
MCV: 89 fL (ref 79–97)
MONOCYTES: 7 %
Monocytes Absolute: 0.6 10*3/uL (ref 0.1–0.9)
NEUTROS PCT: 65 %
Neutrophils Absolute: 6.3 10*3/uL (ref 1.4–7.0)
Platelets: 305 10*3/uL (ref 150–379)
RBC: 4.34 x10E6/uL (ref 3.77–5.28)
RDW: 13.6 % (ref 12.3–15.4)
WBC: 9.6 10*3/uL (ref 3.4–10.8)

## 2016-08-27 LAB — C-REACTIVE PROTEIN: CRP: 16.2 mg/L — ABNORMAL HIGH (ref 0.0–4.9)

## 2016-09-12 ENCOUNTER — Encounter: Payer: Self-pay | Admitting: Family Medicine

## 2017-02-02 ENCOUNTER — Encounter: Payer: Self-pay | Admitting: Physician Assistant

## 2017-02-02 ENCOUNTER — Ambulatory Visit (INDEPENDENT_AMBULATORY_CARE_PROVIDER_SITE_OTHER): Payer: 59 | Admitting: Physician Assistant

## 2017-02-02 VITALS — BP 138/84 | HR 114 | Temp 98.5°F | Resp 17 | Ht 64.0 in | Wt 314.0 lb

## 2017-02-02 DIAGNOSIS — L03317 Cellulitis of buttock: Secondary | ICD-10-CM

## 2017-02-02 DIAGNOSIS — L0231 Cutaneous abscess of buttock: Secondary | ICD-10-CM

## 2017-02-02 MED ORDER — HYDROCODONE-ACETAMINOPHEN 5-325 MG PO TABS
1.0000 | ORAL_TABLET | Freq: Four times a day (QID) | ORAL | 0 refills | Status: DC | PRN
Start: 2017-02-02 — End: 2017-02-07

## 2017-02-02 MED ORDER — AMOXICILLIN-POT CLAVULANATE 875-125 MG PO TABS: 1.0000 | ORAL_TABLET | Freq: Two times a day (BID) | ORAL | 0 refills | Status: DC

## 2017-02-02 NOTE — Patient Instructions (Addendum)
  Take 600 mg of ibuprofen every 6-8 hours as needed for pain.  Do not miss doses of antibiotics.    IF you received an x-ray today, you will receive an invoice from [facility]. Please contact [facility] at [phone] with questions or concerns regarding your invoice.   IF you received labwork today, you will receive an invoice from [facility]. Please contact [facility] at [phone] with questions or concerns regarding your invoice.   Our billing staff will not be able to assist you with questions regarding bills from these companies.  You will be contacted with the lab results as soon as they are available. The fastest way to get your results is to activate your My Chart account. Instructions are located on the last page of this paperwork. If you have not heard from us regarding the results in 2 weeks, please contact this office.

## 2017-02-02 NOTE — Progress Notes (Signed)
    02/02/2017 3:37 PM   DOB: 18-Jul-1966 / MRN: [id number]  SUBJECTIVE:  Veronica Morris is a 51 y.o. female presenting for a painful lump about her left gluteal cleft that has been worsening over the past 4 days.  Denies fever.  She did have some chills last night.  She has tried some creams without improvement.   She has No Known Allergies.   She  has a past medical history of Concussion.    She  reports that she has never smoked. She has never used smokeless tobacco. She reports that she does not drink alcohol or use drugs. She  reports that she does not engage in sexual activity. The patient  has a past surgical history that includes Cholecystectomy.  Her family history includes Arthritis in her father; Fibromyalgia in her mother; Restless legs syndrome in her mother.  Review of Systems  Constitutional: Negative for chills, diaphoresis and fever.  Respiratory: Negative for cough, hemoptysis, sputum production, shortness of breath and wheezing.   Cardiovascular: Negative for chest pain, orthopnea and leg swelling.  Gastrointestinal: Negative for nausea.  Skin: Negative for rash.  Neurological: Negative for dizziness.    The problem list and medications were reviewed and updated by myself where necessary and exist elsewhere in the encounter.   OBJECTIVE:  BP 138/84   Pulse (!) 114   Temp 98.5 F (36.9 C) (Oral)   Resp 17   Ht 5\' 4"  (1.626 m)   Wt (!) 314 lb (142.4 kg)   LMP 01/09/2017 (Approximate)   SpO2 98%   BMI 53.90 kg/m   Pulse Readings from Last 3 Encounters:  02/02/17 (!) 114  08/26/16 (!) 105  04/29/16 (!) 120    Physical Exam  Constitutional: She is oriented to person, place, and time. She appears well-developed and well-nourished. No distress.  Cardiovascular: Normal rate and regular rhythm.   Pulmonary/Chest: Effort normal and breath sounds normal.  Musculoskeletal: Normal range of motion.  Neurological: She is alert and oriented to person, place, and  time. She has normal reflexes. She displays normal reflexes. No cranial nerve deficit. She exhibits normal muscle tone. Coordination normal.  Skin: Skin is warm and dry. Rash noted. She is not diaphoretic.     Psychiatric: She has a normal mood and affect. Her behavior is normal. Judgment and thought content normal.   Risk and benefits discussed and verbal consent obtained. Anesthetic allergies reviewed. Patient anesthetized using 1:1 mix of 2% lidocaine with epi. A 1 cm incision was made using a number 11 blade and purulent material was not expressed.  The was wound packed. The patient tolerated the procedure without difficulty.   A clean dressing was placed and wound care instructions were provided.    No results found for this or any previous visit (from the past 72 hour(s)).  No results found.  ASSESSMENT AND PLAN:  Veronica Morris was seen today for boil on butt.  Diagnoses and all orders for this visit:  Abscess and cellulitis of gluteal region: Augmentin and Norco.  Advised Ibuprofen 600 mg q 6-8 hours as needed.     The patient is advised to call or return to clinic if she does not see an improvement in symptoms, or to seek the care of the closest emergency department if she worsens with the above plan.   [name], MHS, PA-C Primary Care at [facility] 02/02/2017 3:37 PM

## 2017-02-05 ENCOUNTER — Encounter: Payer: Self-pay | Admitting: Physician Assistant

## 2017-02-05 ENCOUNTER — Ambulatory Visit (INDEPENDENT_AMBULATORY_CARE_PROVIDER_SITE_OTHER): Payer: 59 | Admitting: Physician Assistant

## 2017-02-05 VITALS — BP 129/86 | HR 108 | Temp 98.8°F | Resp 20 | Ht 64.0 in | Wt 315.8 lb

## 2017-02-05 DIAGNOSIS — L0501 Pilonidal cyst with abscess: Secondary | ICD-10-CM

## 2017-02-05 NOTE — Patient Instructions (Addendum)
We recommend that you schedule a mammogram for breast cancer screening. Typically, you do not need a referral to do this. Please contact a local imaging center to schedule your mammogram.  [facility] - [phone]  *ask for the Radiology Department [facility] - [phone] or [phone]  [facility] - [phone] [facility] - [phone] [facility] - [phone]  *ask for the Radiology Department [facility] - [phone]  *ask for the Radiology Department [facility] - [phone]  *ask for the Mammography Department [facility] - [phone]     IF you received an x-ray today, you will receive an invoice from [facility]. Please contact [facility] at [phone] with questions or concerns regarding your invoice.   IF you received labwork today, you will receive an invoice from [facility]. Please contact [facility] at [phone] with questions or concerns regarding your invoice.   Our billing staff will not be able to assist you with questions regarding bills from these companies.  You will be contacted with the lab results as soon as they are available. The fastest way to get your results is to activate your My Chart account. Instructions are located on the last page of this paperwork. If you have not heard from us regarding the results in 2 weeks, please contact this office.      

## 2017-02-05 NOTE — Progress Notes (Signed)
    02/06/2017 8:53 AM   DOB: [birth year] / MRN: [id number]  SUBJECTIVE:  Veronica Morris is a 51 y.o. female presenting for recheck of pilonidal. I had tried to lance it last time but was unable to get any puss or a culture.  She has been taking hot baths since and tells me that it is now draining.  She has been taking Augmentin as prescribed.   She has No Known Allergies.   She  has a past medical history of Concussion.    She  reports that she has never smoked. She has never used smokeless tobacco. She reports that she does not drink alcohol or use drugs. She  reports that she does not engage in sexual activity. The patient  has a past surgical history that includes Cholecystectomy.  Her family history includes Arthritis in her father; Fibromyalgia in her mother; Restless legs syndrome in her mother.  Review of Systems  Constitutional: Negative for chills, diaphoresis and fever.  Gastrointestinal: Negative for nausea.  Skin: Negative for rash.  Neurological: Negative for dizziness.    The problem list and medications were reviewed and updated by myself where necessary and exist elsewhere in the encounter.   OBJECTIVE:  BP 129/86 (BP Location: Right Arm, Patient Position: Sitting, Cuff Size: Large)   Pulse (!) 108   Temp 98.8 F (37.1 C) (Oral)   Resp 20   Ht 5\' 4"  (1.626 m)   Wt (!) 315 lb 12.8 oz (143.2 kg)   LMP 01/09/2017 (Approximate)   SpO2 99%   BMI 54.21 kg/m   Risk and benefits discussed and verbal consent obtained. Anesthetic allergies reviewed. Patient anesthetized using 1:1 mix of 2% lidocaine with epi. A 1 cm incision was made using a number 11 blade and COPIOUS purulent material was expressed.  The wound was packed vigorously.  The patient tolerated the procedure without difficulty.   A clean dressing was placed and wound care instructions were provided.    Physical Exam  Constitutional: She is active.  Cardiovascular: Normal rate.   Pulmonary/Chest: Effort  normal. No tachypnea.  Neurological: She is alert.  Skin: Skin is warm. She is not diaphoretic.       No results found for this or any previous visit (from the past 72 hour(s)).  No results found.  ASSESSMENT AND PLAN:  Veronica Morris was seen today for follow-up.  Diagnoses and all orders for this visit:  Pilonidal cyst with abscess: The cyst had developed its own drainage point about the base of the spine. I expanded the opening with an 11 blade and copious pus, blood, and serosanguinous fluids was expressed.  I have written her out of work and expect that this will continue to drain with warm compress.  She will come back in 48 hours for repacking.  I have already billed her for the procedure at her previous visit and I consider this follow up care thus no bill today. She will continue on augmentin and has narcotics if Ibuprofen is inadequate.     The patient is advised to call or return to clinic if she does not see an improvement in symptoms, or to seek the care of the closest emergency department if she worsens with the above plan.   Deliah BostonMichael Clark, MHS, PA-C Primary Care at [facility] 02/06/2017 8:53 AM

## 2017-02-07 ENCOUNTER — Ambulatory Visit (INDEPENDENT_AMBULATORY_CARE_PROVIDER_SITE_OTHER): Payer: 59 | Admitting: Physician Assistant

## 2017-02-07 VITALS — BP 126/84 | HR 91 | Temp 98.7°F | Resp 16 | Ht 64.0 in | Wt 316.2 lb

## 2017-02-07 DIAGNOSIS — L0501 Pilonidal cyst with abscess: Secondary | ICD-10-CM

## 2017-02-07 NOTE — Progress Notes (Signed)
    02/07/2017 2:30 PM   DOB: 21-Dec-1965 / MRN: [id number]  SUBJECTIVE:  Veronica Morris is a 51 y.o. female presenting for wound care.  She continues to improve.  The wound continues to drain. Denies fever, chills, nausea.  Feels that she is improving.   She has No Known Allergies.   She  has a past medical history of Concussion.    She  reports that she has never smoked. She has never used smokeless tobacco. She reports that she does not drink alcohol or use drugs. She  reports that she does not engage in sexual activity. The patient  has a past surgical history that includes Cholecystectomy.  Her family history includes Arthritis in her father; Fibromyalgia in her mother; Restless legs syndrome in her mother.  Review of Systems  Constitutional: Negative for chills, diaphoresis and fever.  Gastrointestinal: Negative for nausea.  Skin: Negative for rash.  Neurological: Negative for dizziness.    The problem list and medications were reviewed and updated by myself where necessary and exist elsewhere in the encounter.   OBJECTIVE:  BP 126/84   Pulse 91   Temp 98.7 F (37.1 C) (Oral)   Resp 16   Ht 5\' 4"  (1.626 m)   Wt (!) 316 lb 3.2 oz (143.4 kg)   LMP 01/09/2017 (Approximate)   SpO2 97%   BMI 54.28 kg/m   Physical Exam  Constitutional: She is active.  Non-toxic appearance.  Cardiovascular: Normal rate.   Pulmonary/Chest: Effort normal. No tachypnea.  Neurological: She is alert.  Skin: Skin is warm and dry. She is not diaphoretic. No pallor.       No results found for this or any previous visit (from the past 72 hour(s)).  No results found.  ASSESSMENT AND PLAN:  Veronica Morris was seen today for wound check.  Diagnoses and all orders for this visit:  Pilonidal cyst with abscess: Improving.  Repacked. RTC in 48 hours. Continue abx.    The patient is advised to call or return to clinic if she does not see an improvement in symptoms, or to seek the care of the closest  emergency department if she worsens with the above plan.   Deliah BostonMichael Clark, MHS, PA-C Primary Care at [facility] 02/07/2017 2:30 PM

## 2017-02-07 NOTE — Patient Instructions (Addendum)
  Continue wam compress.    IF you received an x-ray today, you will receive an invoice from [facility]. Please contact [facility] at [phone] with questions or concerns regarding your invoice.   IF you received labwork today, you will receive an invoice from [facility]. Please contact [facility] at [phone] with questions or concerns regarding your invoice.   Our billing staff will not be able to assist you with questions regarding bills from these companies.  You will be contacted with the lab results as soon as they are available. The fastest way to get your results is to activate your My Chart account. Instructions are located on the last page of this paperwork. If you have not heard from us regarding the results in 2 weeks, please contact this office.

## 2017-02-09 ENCOUNTER — Ambulatory Visit (INDEPENDENT_AMBULATORY_CARE_PROVIDER_SITE_OTHER): Payer: 59 | Admitting: Physician Assistant

## 2017-02-09 ENCOUNTER — Encounter: Payer: Self-pay | Admitting: Physician Assistant

## 2017-02-09 VITALS — BP 126/84 | HR 100 | Temp 97.9°F | Resp 16 | Ht 64.0 in | Wt 316.0 lb

## 2017-02-09 DIAGNOSIS — L0501 Pilonidal cyst with abscess: Secondary | ICD-10-CM

## 2017-02-09 NOTE — Progress Notes (Signed)
   02/09/2017 8:21 AM   DOB: 25-Jul-1966 / MRN: [id number]  SUBJECTIVE:  [name] is a well appearing 51 y.o. here today for wound care. She denies exquisite tenderness at the site of the wound, nausea, emesis, fever and chills.  She has been compliant with medical therapy and recommendations thus far.   She has No Known Allergies.    Review of Systems  Neurological: Negative for dizziness.    Problem list and medications reviewed and updated by myself where necessary, and exist elsewhere in the encounter.   OBJECTIVE:  BP 126/84 (BP Location: Left Arm, Patient Position: Sitting, Cuff Size: Large)   Pulse 100   Temp 97.9 F (36.6 C) (Oral)   Resp 16   Ht 5\' 4"  (1.626 m)   Wt (!) 316 lb (143.3 kg)   LMP 01/19/2017   SpO2 95%   BMI 54.24 kg/m  CrCl cannot be calculated (Patient's most recent lab result is older than the maximum 21 days allowed.).    Physical Exam  Constitutional: She is active.  Non-toxic appearance.  Cardiovascular: Normal rate.   Pulmonary/Chest: Effort normal. No tachypnea.  Neurological: She is alert.  Skin: Skin is warm and dry. She is not diaphoretic. No pallor.       No results found for this or any previous visit (from the past 48 hour(s)).  ASSESSMENT AND PLAN  Veronica Morris was seen today for follow-up.  Diagnoses and all orders for this visit:  Pilonidal cyst with abscess: Packing replaced.  Advised that she can pull the packing on Monday or I would be happy to see her back if she feels that there are any problems.  I did advise that we plan a physical given she will be turning 52 soon.  She will come back after her birthday.     The patient was advised to call or return to clinic if she does not see an improvement in symptoms or to seek the care of the closest emergency department if she worsens with the above plan.   Deliah BostonMichael Clark, MHS, PA-C Primary Care at [facility] 02/09/2017 8:21 AM

## 2017-02-09 NOTE — Patient Instructions (Signed)
     IF you received an x-ray today, you will receive an invoice from [facility]. Please contact [facility] at [phone] with questions or concerns regarding your invoice.   IF you received labwork today, you will receive an invoice from [facility]. Please contact [facility] at [phone] with questions or concerns regarding your invoice.   Our billing staff will not be able to assist you with questions regarding bills from these companies.  You will be contacted with the lab results as soon as they are available. The fastest way to get your results is to activate your My Chart account. Instructions are located on the last page of this paperwork. If you have not heard from us regarding the results in 2 weeks, please contact this office.     

## 2017-02-21 ENCOUNTER — Encounter: Payer: Self-pay | Admitting: Physician Assistant

## 2017-02-21 ENCOUNTER — Ambulatory Visit (INDEPENDENT_AMBULATORY_CARE_PROVIDER_SITE_OTHER): Payer: 59 | Admitting: Physician Assistant

## 2017-02-21 VITALS — BP 136/86 | HR 107 | Temp 98.2°F | Resp 17 | Ht 64.0 in | Wt 319.0 lb

## 2017-02-21 DIAGNOSIS — H60392 Other infective otitis externa, left ear: Secondary | ICD-10-CM

## 2017-02-21 MED ORDER — AMOXICILLIN-POT CLAVULANATE 875-125 MG PO TABS: 1.0000 | ORAL_TABLET | Freq: Two times a day (BID) | ORAL | 0 refills | Status: DC

## 2017-02-21 NOTE — Progress Notes (Signed)
    02/21/2017 4:07 PM   DOB: 09/16/1965 / MRN: [id number]  SUBJECTIVE:  Veronica Morris is a 51 y.o. female presenting for left sided ear pain that started 3 days ago.  The pain is painful to the the touch.  Hearing is normal. Denies tinnitus. No sore throat. No abnormal nasal congestion.   She has No Known Allergies.   She  has a past medical history of Concussion.    She  reports that she has never smoked. She has never used smokeless tobacco. She reports that she does not drink alcohol or use drugs. She  reports that she does not engage in sexual activity. The patient  has a past surgical history that includes Cholecystectomy.  Her family history includes Arthritis in her father; Fibromyalgia in her mother; Restless legs syndrome in her mother.  Review of Systems  Constitutional: Negative for fever.  HENT: Positive for ear pain. Negative for ear discharge, hearing loss and tinnitus.   Neurological: Negative for dizziness.    The problem list and medications were reviewed and updated by myself where necessary and exist elsewhere in the encounter.   OBJECTIVE:  BP 136/86   Pulse (!) 107   Temp 98.2 F (36.8 C) (Oral)   Resp 17   Ht 5\' 4"  (1.626 m)   Wt (!) 319 lb (144.7 kg)   LMP 02/12/2017 (Approximate)   SpO2 98%   BMI 54.76 kg/m   Physical Exam  Constitutional: She is active.  Non-toxic appearance.  HENT:  Right Ear: Hearing, tympanic membrane, external ear and ear canal normal.  Left Ear: Hearing, tympanic membrane, external ear and ear canal normal.  Ears:  Nose: Nose normal. Right sinus exhibits no maxillary sinus tenderness and no frontal sinus tenderness. Left sinus exhibits no maxillary sinus tenderness and no frontal sinus tenderness.  Mouth/Throat: Uvula is midline, oropharynx is clear and moist and mucous membranes are normal. Mucous membranes are not dry. No oropharyngeal exudate, posterior oropharyngeal edema or tonsillar abscesses.  Cardiovascular: Normal  rate and regular rhythm.   Pulmonary/Chest: Effort normal. No tachypnea.  Lymphadenopathy:       Head (right side): No submandibular and no tonsillar adenopathy present.       Head (left side): No submandibular and no tonsillar adenopathy present.    She has no cervical adenopathy.  Neurological: She is alert.  Skin: Skin is warm and dry. She is not diaphoretic. No pallor.    No results found for this or any previous visit (from the past 72 hour(s)).  No results found.  ASSESSMENT AND PLAN:  Veronica Morris was seen today for ear pain.  Diagnoses and all orders for this visit:  Infective otitis externa of left ear:  vs early abscess Will cover for both gram negative and positive organisms with abx.  She will come back in 3 days if not improving so we can consider an image if she is not improving.   -     amoxicillin-clavulanate (AUGMENTIN) 875-125 MG tablet; Take 1 tablet by mouth 2 (two) times daily.    The patient is advised to call or return to clinic if she does not see an improvement in symptoms, or to seek the care of the closest emergency department if she worsens with the above plan.   Deliah BostonMichael Meliss Fleek, MHS, PA-C Primary Care at [facility] 02/21/2017 4:07 PM

## 2017-02-21 NOTE — Patient Instructions (Signed)
     IF you received an x-ray today, you will receive an invoice from [facility]. Please contact [facility] at [phone] with questions or concerns regarding your invoice.   IF you received labwork today, you will receive an invoice from [facility]. Please contact [facility] at [phone] with questions or concerns regarding your invoice.   Our billing staff will not be able to assist you with questions regarding bills from these companies.  You will be contacted with the lab results as soon as they are available. The fastest way to get your results is to activate your My Chart account. Instructions are located on the last page of this paperwork. If you have not heard from us regarding the results in 2 weeks, please contact this office.     

## 2017-02-24 ENCOUNTER — Ambulatory Visit (INDEPENDENT_AMBULATORY_CARE_PROVIDER_SITE_OTHER): Payer: 59 | Admitting: Family Medicine

## 2017-02-24 ENCOUNTER — Encounter: Payer: Self-pay | Admitting: Family Medicine

## 2017-02-24 VITALS — BP 134/84 | HR 92 | Temp 97.5°F | Resp 16 | Ht 64.0 in | Wt 317.2 lb

## 2017-02-24 DIAGNOSIS — H60392 Other infective otitis externa, left ear: Secondary | ICD-10-CM | POA: Diagnosis not present

## 2017-02-24 DIAGNOSIS — L0390 Cellulitis, unspecified: Secondary | ICD-10-CM

## 2017-02-24 DIAGNOSIS — H9202 Otalgia, left ear: Secondary | ICD-10-CM

## 2017-02-24 LAB — POCT CBC
GRANULOCYTE PERCENT: 79.6 % (ref 37–80)
HCT, POC: 35.6 % — AB (ref 37.7–47.9)
Hemoglobin: 12.1 g/dL — AB (ref 12.2–16.2)
Lymph, poc: 2 (ref 0.6–3.4)
MCH, POC: 28.5 pg (ref 27–31.2)
MCHC: 33.9 g/dL (ref 31.8–35.4)
MCV: 84.1 fL (ref 80–97)
MID (CBC): 0.6 (ref 0–0.9)
MPV: 6.3 fL (ref 0–99.8)
PLATELET COUNT, POC: 345 10*3/uL (ref 142–424)
POC Granulocyte: 10.2 — AB (ref 2–6.9)
POC LYMPH %: 15.7 % (ref 10–50)
POC MID %: 4.7 %M (ref 0–12)
RBC: 4.23 M/uL (ref 4.04–5.48)
RDW, POC: 12.9 %
WBC: 12.8 10*3/uL — AB (ref 4.6–10.2)

## 2017-02-24 LAB — POCT SEDIMENTATION RATE: POCT SED RATE: 56 mm/hr — AB (ref 0–22)

## 2017-02-24 MED ORDER — CIPROFLOXACIN HCL 750 MG PO TABS: 750.0000 mg | ORAL_TABLET | Freq: Two times a day (BID) | ORAL | 0 refills | Status: AC

## 2017-02-24 MED ORDER — CIPROFLOXACIN-DEXAMETHASONE 0.3-0.1 % OT SUSP: 4.0000 [drp] | Freq: Two times a day (BID) | OTIC | 0 refills | Status: AC

## 2017-02-24 NOTE — Patient Instructions (Addendum)
Stop the amoxicillin clavulanate. Start Cipro 1 pill twice per day. Start Ciprodex drops. Recheck Monday with myself or Deliah Boston, PA-C. Return to the clinic or go to the nearest emergency room if any of your symptoms worsen or new symptoms occur.  Otitis Externa Otitis externa is an infection of the outer ear canal. The outer ear canal is the area between the outside of the ear and the eardrum. Otitis externa is sometimes called "swimmer's ear." What are the causes? This condition may be caused by:  Swimming in dirty water.  Moisture in the ear.  An injury to the inside of the ear.  An object stuck in the ear.  A cut or scrape on the outside of the ear.  What increases the risk? This condition is more likely to develop in swimmers. What are the signs or symptoms? The first symptom of this condition is often itching in the ear. Later signs and symptoms include:  Swelling of the ear.  Redness in the ear.  Ear pain. The pain may get worse when you pull on your ear.  Pus coming from the ear.  How is this diagnosed? This condition may be diagnosed by examining the ear and testing fluid from the ear for bacteria and funguses. How is this treated? This condition may be treated with:  Antibiotic ear drops. These are often given for 10-14 days.  Medicine to reduce itching and swelling.  Follow these instructions at home:  If you were prescribed antibiotic ear drops, apply them as told by your health care provider. Do not stop using the antibiotic even if your condition improves.  Take over-the-counter and prescription medicines only as told by your health care provider.  Keep all follow-up visits as told by your health care provider. This is important. How is this prevented?  Keep your ear dry. Use the corner of a towel to dry your ear after you swim or bathe.  Avoid scratching or putting things in your ear. Doing these things can damage the ear canal or remove the  protective wax that lines it, which makes it easier for bacteria and funguses to grow.  Avoid swimming in lakes, polluted water, or pools that may not have the right amount of chlorine.  Consider making ear drops and putting 3 or 4 drops in each ear after you swim. Ask your health care provider about how you can make ear drops. Contact a health care provider if:  You have a fever.  After 3 days your ear is still red, swollen, painful, or draining pus.  Your redness, swelling, or pain gets worse.  You have a severe headache.  You have redness, swelling, pain, or tenderness in the area behind your ear. This information is not intended to replace advice given to you by your health care provider. Make sure you discuss any questions you have with your health care provider. Document Released: 08/14/2005 Document Revised: 09/21/2015 Document Reviewed: 05/24/2015 Elsevier Interactive Patient Education  2018 [employer].   Cellulitis, Adult Cellulitis is a skin infection. The infected area is usually red and tender. This condition occurs most often in the arms and lower legs. The infection can travel to the muscles, blood, and underlying tissue and become serious. It is very important to get treated for this condition. What are the causes? Cellulitis is caused by bacteria. The bacteria enter through a break in the skin, such as a cut, burn, insect bite, open sore, or crack. What increases the risk? This  condition is more likely to occur in people who:  Have a weak defense system (immune system).  Have open wounds on the skin such as cuts, burns, bites, and scrapes. Bacteria can enter the body through these open wounds.  Are older.  Have diabetes.  Have a type of long-lasting (chronic) liver disease (cirrhosis) or kidney disease.  Use IV drugs.  What are the signs or symptoms? Symptoms of this condition include:  Redness, streaking, or spotting on the skin.  Swollen area of the  skin.  Tenderness or pain when an area of the skin is touched.  Warm skin.  Fever.  Chills.  Blisters.  How is this diagnosed? This condition is diagnosed based on a medical history and physical exam. You may also have tests, including:  Blood tests.  Lab tests.  Imaging tests.  How is this treated? Treatment for this condition may include:  Medicines, such as antibiotic medicines or antihistamines.  Supportive care, such as rest and application of cold or warm cloths (cold or warm compresses) to the skin.  Hospital care, if the condition is severe.  The infection usually gets better within 1-2 days of treatment. Follow these instructions at home:  Take over-the-counter and prescription medicines only as told by your health care provider.  If you were prescribed an antibiotic medicine, take it as told by your health care provider. Do not stop taking the antibiotic even if you start to feel better.  Drink enough fluid to keep your urine clear or pale yellow.  Do not touch or rub the infected area.  Raise (elevate) the infected area above the level of your heart while you are sitting or lying down.  Apply warm or cold compresses to the affected area as told by your health care provider.  Keep all follow-up visits as told by your health care provider. This is important. These visits let your health care provider make sure a more serious infection is not developing. Contact a health care provider if:  You have a fever.  Your symptoms do not improve within 1-2 days of starting treatment.  Your bone or joint underneath the infected area becomes painful after the skin has healed.  Your infection returns in the same area or another area.  You notice a swollen bump in the infected area.  You develop new symptoms.  You have a general ill feeling (malaise) with muscle aches and pains. Get help right away if:  Your symptoms get worse.  You feel very sleepy.  You  develop vomiting or diarrhea that persists.  You notice red streaks coming from the infected area.  Your red area gets larger or turns dark in color. This information is not intended to replace advice given to you by your health care provider. Make sure you discuss any questions you have with your health care provider. Document Released: 05/24/2005 Document Revised: 12/23/2015 Document Reviewed: 06/23/2015 Elsevier Interactive Patient Education  2017 [employer]   IF you received an x-ray today, you will receive an invoice from [facility]. Please contact [facility] at [phone] with questions or concerns regarding your invoice.   IF you received labwork today, you will receive an invoice from [facility]. Please contact [facility] at [phone] with questions or concerns regarding your invoice.   Our billing staff will not be able to assist you with questions regarding bills from these companies.  You will be contacted with the lab results as soon as they are available. The fastest way to  get your results is to activate your My Chart account. Instructions are located on the last page of this paperwork. If you have not heard from [place] regarding the results in 2 weeks, please contact this office.

## 2017-02-24 NOTE — Progress Notes (Signed)
By signing my name below, I, Mesha Guinyard, attest that this documentation has been prepared under the direction and in the presence of Meredith Staggers, MD.  Electronically Signed: Arvilla Market, Medical Scribe. 02/24/17. 2:02 PM.  Subjective:    Patient ID: Veronica Morris, female    DOB: 1965-10-09, 51 y.o.   MRN: [id number]  HPI Chief Complaint  Patient presents with  . Follow-up    ear pain not any better     HPI Comments: Veronica Morris is a 51 y.o. female who presents to Primary Care at [facility] for left ear infection follow-up. She was seen 3 days ago with left sided ear pain. Concern for early abscess or early otitis - started on augmentin.  Her initial onset was about 6. Reports her sxs have been worse since the last time she was here. Pain worsens at night. Pt is experiencing associated sxs of increased ear swelling, sharp shooting pain in her ear, muffled hearing, and a small amount of yellow discharge last night with heating pad. Pt describes muffled hearing/blocked ears as if she's climbing up a mountain. She also reports nasal congestion. Pt has took 7 doses of augmentin, ibuprofen, tylenol, rubbing peroxide in her ear, and a heating pad on her ear for some relief of her sxs. Pt used zyrtec last week without relief of her sxs. Denies fever, haring loss, and rhinorrhea.  Patient Active Problem List   Diagnosis Date Noted  . Microscopic hematuria 08/27/2016  . Rhinitis, allergic 04/20/2015  . 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 Wound, open, foot 02/12/2012   Past Medical History:  Diagnosis Date  . Concussion    Past Surgical History:  Procedure Laterality Date  . CHOLECYSTECTOMY     No Known Allergies Prior to Admission medications   Medication Sig Start Date End Date Taking? Authorizing Provider  amoxicillin-clavulanate (AUGMENTIN) 875-125 MG tablet Take 1 tablet by mouth 2 (two) times daily. 02/21/17   Ofilia Neas, PA-C  cetirizine (ZYRTEC) 10 MG  tablet Take 10 mg by mouth daily.    [provider]   Social History   Social History  . Marital status: Divorced    Spouse name: N/A  . Number of children: N/A  . Years of education: N/A   Occupational History  . Not on file.   Social History Main Topics  . Smoking status: Never Smoker  . Smokeless tobacco: Never Used  . Alcohol use No  . Drug use: No  . Sexual activity: No   Other Topics Concern  . Not on file   Social History Narrative  . No narrative on file   Review of Systems  Constitutional: Negative for fever.  HENT: Positive for ear discharge, ear pain and facial swelling (ear). Negative for hearing loss and rhinorrhea.    Objective:  Physical Exam  Constitutional: She appears well-developed and well-nourished. No distress.  HENT:  Head: Normocephalic and atraumatic.  Edema of the left canal as well as soft tissue swelling Swelling of pinna pearcing intact without soft tissue or swelling Slight preauricular swelling Minimal mastoid tenderness No discharge seen in canal Difficulty visualizing TM but visualized aspect pearly and grey Small amount of exudate in the interior of the canal Weber testing localizes sound to left Rinne bone and air conduction equal  Eyes: Conjunctivae are normal.  Neck: Neck supple.  Cardiovascular: Normal rate.   Pulmonary/Chest: Effort normal.  Lymphadenopathy:    She has no cervical adenopathy.  Neurological: She is alert.  Skin: Skin is warm and dry.  Psychiatric: She has a normal mood and affect. Her behavior is normal.  Nursing note and vitals reviewed.   Vitals:   02/24/17 1326  BP: 134/84  Pulse: 92  Resp: 16  Temp: 97.5 F (36.4 C)  TempSrc: Oral  SpO2: 97%  Weight: (!) 317 lb 3.2 oz (143.9 kg)  Height: 5\' 4"  (1.626 m)  Body mass index is 54.45 kg/m.   Results for orders placed or performed in visit on 02/24/17  POCT CBC  Result Value Ref Range   WBC 12.8 (A) 4.6 - 10.2 K/uL   Lymph, poc 2.0  0.6 - 3.4   POC LYMPH PERCENT 15.7 10 - 50 %L   MID (cbc) 0.6 0 - 0.9   POC MID % 4.7 0 - 12 %M   POC Granulocyte 10.2 (A) 2 - 6.9   Granulocyte percent 79.6 37 - 80 %G   RBC 4.23 4.04 - 5.48 M/uL   Hemoglobin 12.1 (A) 12.2 - 16.2 g/dL   HCT, POC 16.1 (A) 09.6 - 47.9 %   MCV 84.1 80 - 97 fL   MCH, POC 28.5 27 - 31.2 pg   MCHC 33.9 31.8 - 35.4 g/dL   RDW, POC 04.5 %   Platelet Count, POC 345 142 - 424 K/uL   MPV 6.3 0 - 99.8 fL   Assessment & Plan:    Veronica Morris is a 51 y.o. female Cellulitis, unspecified cellulitis site - Plan: POCT CBC, POCT SEDIMENTATION RATE, ciprofloxacin (CIPRO) 750 MG tablet  Left ear pain - Plan: POCT CBC  Infective otitis externa of left ear - Plan: ciprofloxacin (CIPRO) 750 MG tablet, ciprofloxacin-dexamethasone (CIPRODEX) OTIC suspension  Suspicious for infective otitis externa with possible early malignant otitis externa versus soft tissue extension. Now with signs of cellulitis of pinna. Does not appear to have typical findings of mastoiditis. Differential includes Pseudomonas involvement as worsening symptoms on Augmentin. Discussed with ENT.  -Change Augmentin to Cipro 750 mg twice a day.   -Add Ciprodex 4 drops twice a day  -Recheck in 48 hours, RTC/ER precautions discussed if worsening sooner. Depending on improvement on Monday can decide if ENT evaluation needed  Meds ordered this encounter  Medications  . ciprofloxacin (CIPRO) 750 MG tablet    Sig: Take 1 tablet (750 mg total) by mouth 2 (two) times daily.    Dispense:  20 tablet    Refill:  0  . ciprofloxacin-dexamethasone (CIPRODEX) OTIC suspension    Sig: Place 4 drops into the left ear 2 (two) times daily.    Dispense:  7.5 mL    Refill:  0   Patient Instructions    Stop the amoxicillin clavulanate. Start Levaquin 1 pill twice per day. Start Ciprodex drops. Recheck Monday with myself or Deliah Boston, PA-C. Return to the clinic or go to the nearest emergency room if any of your  symptoms worsen or new symptoms occur.   Otitis Externa Otitis externa is an infection of the outer ear canal. The outer ear canal is the area between the outside of the ear and the eardrum. Otitis externa is sometimes called "swimmer's ear." What are the causes? This condition may be caused by:  Swimming in dirty water.  Moisture in the ear.  An injury to the inside of the ear.  An object stuck in the ear.  A cut or scrape on the outside of the ear.  What increases the risk? This condition is more likely  to develop in swimmers. What are the signs or symptoms? The first symptom of this condition is often itching in the ear. Later signs and symptoms include:  Swelling of the ear.  Redness in the ear.  Ear pain. The pain may get worse when you pull on your ear.  Pus coming from the ear.  How is this diagnosed? This condition may be diagnosed by examining the ear and testing fluid from the ear for bacteria and funguses. How is this treated? This condition may be treated with:  Antibiotic ear drops. These are often given for 10-14 days.  Medicine to reduce itching and swelling.  Follow these instructions at home:  If you were prescribed antibiotic ear drops, apply them as told by your health care provider. Do not stop using the antibiotic even if your condition improves.  Take over-the-counter and prescription medicines only as told by your health care provider.  Keep all follow-up visits as told by your health care provider. This is important. How is this prevented?  Keep your ear dry. Use the corner of a towel to dry your ear after you swim or bathe.  Avoid scratching or putting things in your ear. Doing these things can damage the ear canal or remove the protective wax that lines it, which makes it easier for bacteria and funguses to grow.  Avoid swimming in lakes, polluted water, or pools that may not have the right amount of chlorine.  Consider making ear drops  and putting 3 or 4 drops in each ear after you swim. Ask your health care provider about how you can make ear drops. Contact a health care provider if:  You have a fever.  After 3 days your ear is still red, swollen, painful, or draining pus.  Your redness, swelling, or pain gets worse.  You have a severe headache.  You have redness, swelling, pain, or tenderness in the area behind your ear. This information is not intended to replace advice given to you by your health care provider. Make sure you discuss any questions you have with your health care provider. Document Released: 08/14/2005 Document Revised: 09/21/2015 Document Reviewed: 05/24/2015 Elsevier Interactive Patient Education  2018 [employer]   Cellulitis, Adult Cellulitis is a skin infection. The infected area is usually red and tender. This condition occurs most often in the arms and lower legs. The infection can travel to the muscles, blood, and underlying tissue and become serious. It is very important to get treated for this condition. What are the causes? Cellulitis is caused by bacteria. The bacteria enter through a break in the skin, such as a cut, burn, insect bite, open sore, or crack. What increases the risk? This condition is more likely to occur in people who:  Have a weak defense system (immune system).  Have open wounds on the skin such as cuts, burns, bites, and scrapes. Bacteria can enter the body through these open wounds.  Are older.  Have diabetes.  Have a type of long-lasting (chronic) liver disease (cirrhosis) or kidney disease.  Use IV drugs.  What are the signs or symptoms? Symptoms of this condition include:  Redness, streaking, or spotting on the skin.  Swollen area of the skin.  Tenderness or pain when an area of the skin is touched.  Warm skin.  Fever.  Chills.  Blisters.  How is this diagnosed? This condition is diagnosed based on a medical history and physical exam. You  may also have tests, including:  Blood  tests.  Lab tests.  Imaging tests.  How is this treated? Treatment for this condition may include:  Medicines, such as antibiotic medicines or antihistamines.  Supportive care, such as rest and application of cold or warm cloths (cold or warm compresses) to the skin.  Hospital care, if the condition is severe.  The infection usually gets better within 1-2 days of treatment. Follow these instructions at home:  Take over-the-counter and prescription medicines only as told by your health care provider.  If you were prescribed an antibiotic medicine, take it as told by your health care provider. Do not stop taking the antibiotic even if you start to feel better.  Drink enough fluid to keep your urine clear or pale yellow.  Do not touch or rub the infected area.  Raise (elevate) the infected area above the level of your heart while you are sitting or lying down.  Apply warm or cold compresses to the affected area as told by your health care provider.  Keep all follow-up visits as told by your health care provider. This is important. These visits let your health care provider make sure a more serious infection is not developing. Contact a health care provider if:  You have a fever.  Your symptoms do not improve within 1-2 days of starting treatment.  Your bone or joint underneath the infected area becomes painful after the skin has healed.  Your infection returns in the same area or another area.  You notice a swollen bump in the infected area.  You develop new symptoms.  You have a general ill feeling (malaise) with muscle aches and pains. Get help right away if:  Your symptoms get worse.  You feel very sleepy.  You develop vomiting or diarrhea that persists.  You notice red streaks coming from the infected area.  Your red area gets larger or turns dark in color. This information is not intended to replace advice given to  you by your health care provider. Make sure you discuss any questions you have with your health care provider. Document Released: 05/24/2005 Document Revised: 12/23/2015 Document Reviewed: 06/23/2015 Elsevier Interactive Patient Education  2017 [employer].   IF you received an x-ray today, you will receive an invoice from [facility]. Please contact [facility] at [phone] with questions or concerns regarding your invoice.   IF you received labwork today, you will receive an invoice from [facility]. Please contact [facility] at [phone] with questions or concerns regarding your invoice.   Our billing staff will not be able to assist you with questions regarding bills from these companies.  You will be contacted with the lab results as soon as they are available. The fastest way to get your results is to activate your My Chart account. Instructions are located on the last page of this paperwork. If you have not heard from [place] regarding the results in 2 weeks, please contact this office.       I personally performed the services described in this documentation, which was scribed in my presence. The recorded information has been reviewed and considered for accuracy and completeness, addended by me as needed, and agree with information above.  Signed,   Meredith Staggers, MD Primary Care at [facility].  02/24/17 4:38 PM

## 2017-02-26 ENCOUNTER — Encounter: Payer: Self-pay | Admitting: Family Medicine

## 2017-02-26 ENCOUNTER — Ambulatory Visit (INDEPENDENT_AMBULATORY_CARE_PROVIDER_SITE_OTHER): Payer: 59 | Admitting: Family Medicine

## 2017-02-26 VITALS — BP 143/84 | HR 93 | Temp 98.2°F | Resp 16 | Ht 64.0 in | Wt 315.8 lb

## 2017-02-26 DIAGNOSIS — H6012 Cellulitis of left external ear: Secondary | ICD-10-CM | POA: Diagnosis not present

## 2017-02-26 DIAGNOSIS — H60392 Other infective otitis externa, left ear: Secondary | ICD-10-CM | POA: Diagnosis not present

## 2017-02-26 NOTE — Patient Instructions (Addendum)
  Your ear appears to be doing well and does appear to be improving. Continue same antibiotics, ibuprofen if needed, and follow-up with Sarah Friday morning. If any worsening swelling, fevers, or you feel like you are worsening prior to that time, return here sooner or the emergency room if needed.   IF you received an x-ray today, you will receive an invoice from [facility]. Please contact [facility] at [phone] with questions or concerns regarding your invoice.   IF you received labwork today, you will receive an invoice from [facility]. Please contact [facility] at [phone] with questions or concerns regarding your invoice.   Our billing staff will not be able to assist you with questions regarding bills from these companies.  You will be contacted with the lab results as soon as they are available. The fastest way to get your results is to activate your My Chart account. Instructions are located on the last page of this paperwork. If you have not heard from us regarding the results in 2 weeks, please contact this office.

## 2017-02-26 NOTE — Progress Notes (Addendum)
Subjective:  By signing my name below, I, Stann Ore, attest that this documentation has been prepared under the direction and in the presence of Meredith Staggers, MD. Electronically Signed: Stann Ore, Scribe. 02/26/2017 , 3:06 PM .  Patient was seen in Room 1 .   Patient ID: Veronica Morris, female    DOB: [birth year], 51 y.o.   MRN: [id number] Chief Complaint  Patient presents with   Follow-up    on ear    HPI Veronica Morris is a 51 y.o. female  Here for follow up on left ear pain. She was last seen 2 days ago, thought to have component of cellulitis and left otitis externa. Discussed with ENT, Augmentin was changed to Cipro 750mg  BID and Ciprodex suspension twice a day. Here for follow up.   Patient states her pain was a little worse when she left here 2 days ago. She notes the swelling was worse yesterday but better this morning. She took 3 ibuprofen last night before going to bed. She denies any fevers. Her left ear still feels stuffed up and hearing is still muffled. She denies abdominal pain or diarrhea with the antibiotics.   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 Authorizing Provider  cetirizine (ZYRTEC) 10 MG tablet Take 10 mg by mouth daily.   Yes [provider]  ciprofloxacin (CIPRO) 750 MG tablet Take 1 tablet (750 mg total) by mouth 2 (two) times daily. 02/24/17  Yes Shade Flood, MD  ciprofloxacin-dexamethasone (CIPRODEX) OTIC suspension Place 4 drops into the left ear 2 (two) times daily. 02/24/17  Yes Shade Flood, MD   Social History   Social History     Marital status: Divorced    Spouse name: N/A   Number of children: N/A   Years of education: N/A   Occupational History   Not on file.   Social History Main Topics   Smoking status: Never Smoker   Smokeless tobacco: Never Used   Alcohol use No   Drug use: No   Sexual activity: No   Other Topics Concern   Not on file   Social History Narrative   No narrative on file   Review of Systems  Constitutional: Negative for chills, fatigue, fever and unexpected weight change.  HENT: Positive for ear pain and hearing loss. Negative for ear discharge.   Respiratory: Negative for cough.   Gastrointestinal: Negative for abdominal pain, constipation, diarrhea, nausea and vomiting.  Skin: Negative for rash and wound.  Neurological: Negative for dizziness, weakness and headaches.       Objective:   Physical Exam  Constitutional: She is oriented to person, place, and time. She appears well-developed and well-nourished. No distress.  HENT:  Head: Normocephalic and atraumatic.  Left ear: minimal soft tissue swelling of the left ear, slight warmth, slight erythema, some dry skin at the entrance of the canal, still some canal edema; visualized TM was slightly dull but no apparent effusion; some dried exudate in canal, but it's patent; tender posteriorly to the ear, but no apparent adenopathy; mastoid non tender, no rash or vesicles  Eyes: EOM are normal. Pupils are equal, round, and reactive to light.  Neck: Neck supple.  Cardiovascular: Normal rate.   Pulmonary/Chest: Effort normal. No respiratory distress.  Musculoskeletal: Normal range of motion.  Neurological: She is alert and oriented to person, place, and time.  Skin: Skin is warm and dry.  Psychiatric: She has a normal mood and affect. Her behavior is normal.  Nursing note and vitals reviewed.   Vitals:   02/26/17 1438  BP: (!) 143/84  Pulse: 93  Resp: 16  Temp: 98.2 F (36.8 C)  TempSrc: Oral  SpO2: 98%   Weight: (!) 315 lb 12.8 oz (143.2 kg)  Height: 5\' 4"  (1.626 m)      Assessment & Plan:   Veronica Morris is a 51 y.o. female Cellulitis of ear, left  Infective otitis externa of left ear  Suspected otitis externa with extension to soft tissue/cellulitis. Improving on new regimen of Cipro 750 mg twice a day and Ciprodex. Continue same regimen, follow-up with Benny LennertSarah Weber, PA-C in 4 days, sooner if any worsening of symptoms. Second exam by Benny LennertSarah Weber today.   No orders of the defined types were placed in this encounter.  Patient Instructions    Your ear appears to be doing well and does appear to be improving. Continue same antibiotics, ibuprofen if needed, and follow-up with Sarah Friday morning. If any worsening swelling, fevers, or you feel like you are worsening prior to that time, return here sooner or the emergency room if needed.   IF you received an x-ray today, you will receive an invoice from [facility]. Please contact [facility] at [phone] with questions or concerns regarding your invoice.   IF you received labwork today, you will receive an invoice from [facility]. Please contact [facility] at [phone] with questions or concerns regarding your invoice.   Our billing staff will not be able to assist you with questions regarding bills from these companies.  You will be contacted with the lab results as soon as they are available. The fastest way to get your results is to activate your My Chart account. Instructions are located on the last page of this paperwork. If you have not heard from us regarding the results in 2 weeks, please contact this office.

## 2017-03-02 ENCOUNTER — Encounter: Payer: Self-pay | Admitting: Physician Assistant

## 2017-03-02 ENCOUNTER — Ambulatory Visit (INDEPENDENT_AMBULATORY_CARE_PROVIDER_SITE_OTHER): Payer: 59 | Admitting: Physician Assistant

## 2017-03-02 VITALS — BP 131/81 | HR 96 | Temp 98.5°F | Resp 18 | Ht 64.0 in | Wt 312.0 lb

## 2017-03-02 DIAGNOSIS — H6012 Cellulitis of left external ear: Secondary | ICD-10-CM | POA: Diagnosis not present

## 2017-03-02 NOTE — Patient Instructions (Signed)
     IF you received an x-ray today, you will receive an invoice from [facility]. Please contact [facility] at [phone] with questions or concerns regarding your invoice.   IF you received labwork today, you will receive an invoice from [facility]. Please contact [facility] at [phone] with questions or concerns regarding your invoice.   Our billing staff will not be able to assist you with questions regarding bills from these companies.  You will be contacted with the lab results as soon as they are available. The fastest way to get your results is to activate your My Chart account. Instructions are located on the last page of this paperwork. If you have not heard from us regarding the results in 2 weeks, please contact this office.     

## 2017-03-02 NOTE — Progress Notes (Signed)
   Veronica NiemannSharon H Morris  MRN: [id number] DOB: Feb 29, 1966  PCP: Ofilia Neaslark, Michael L, PA-C  Chief Complaint  Patient presents with  . Ear Pain    follow up on [place] ear   pt states it is feeling better     Subjective:  Pt presents to clinic for recheck of her left ear.  The pain has resolved and she is tolerating the abx ok.  She feels so much better.  Review of Systems  Constitutional: Negative for chills and fever.  HENT: Negative for ear pain.   Gastrointestinal: Negative for nausea.  Skin: Positive for wound. Negative for rash.    Patient Active Problem List   Diagnosis Date Noted  . Microscopic hematuria 08/27/2016  . Rhinitis, allergic 04/20/2015  . 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 Wound, open, foot 02/12/2012    Current Outpatient Prescriptions on File Prior to Visit  Medication Sig Dispense Refill  . cetirizine (ZYRTEC) 10 MG tablet Take 10 mg by mouth daily.    . ciprofloxacin (CIPRO) 750 MG tablet Take 1 tablet (750 mg total) by mouth 2 (two) times daily. 20 tablet 0  . ciprofloxacin-dexamethasone (CIPRODEX) OTIC suspension Place 4 drops into the left ear 2 (two) times daily. 7.5 mL 0   No current facility-administered medications on file prior to visit.     No Known Allergies  Pt patients past, family and social history were reviewed and updated.   Objective:  BP 131/81   Pulse 96   Temp 98.5 F (36.9 C) (Oral)   Resp 18   Ht 5\' 4"  (1.626 m)   Wt (!) 312 lb (141.5 kg)   LMP 02/12/2017 (Approximate)   SpO2 96%   BMI 53.55 kg/m   Physical Exam  Constitutional: She is oriented to person, place, and time and well-developed, well-nourished, and in no distress.  HENT:  Head: Normocephalic and atraumatic.  Right Ear: Hearing and external ear normal.  Left Ear: Hearing and external ear normal. No drainage or swelling. No mastoid tenderness.  Eyes: Conjunctivae are normal.  Neck: Normal range of motion.  Pulmonary/Chest: Effort normal.    Neurological: She is alert and oriented to person, place, and time. Gait normal.  Skin: Skin is warm and dry.  Psychiatric: Mood, memory, affect and judgment normal.  Vitals reviewed.   Assessment and Plan :  Cellulitis of ear, left improved - finish oral abx and continue gtts for a total of 10 days  Benny LennertSarah Claudis Giovanelli PA-C  [facility] at [facility] 03/02/2017 8:28 AM

## 2017-05-27 IMAGING — DX DG KNEE COMPLETE 4+V*L*
3 series · 3 of 3 positions shown · non-contrast
Comparison: None.

CLINICAL DATA: Left knee pain.

EXAM:
LEFT KNEE - COMPLETE 4+ VIEW

[knee ap]
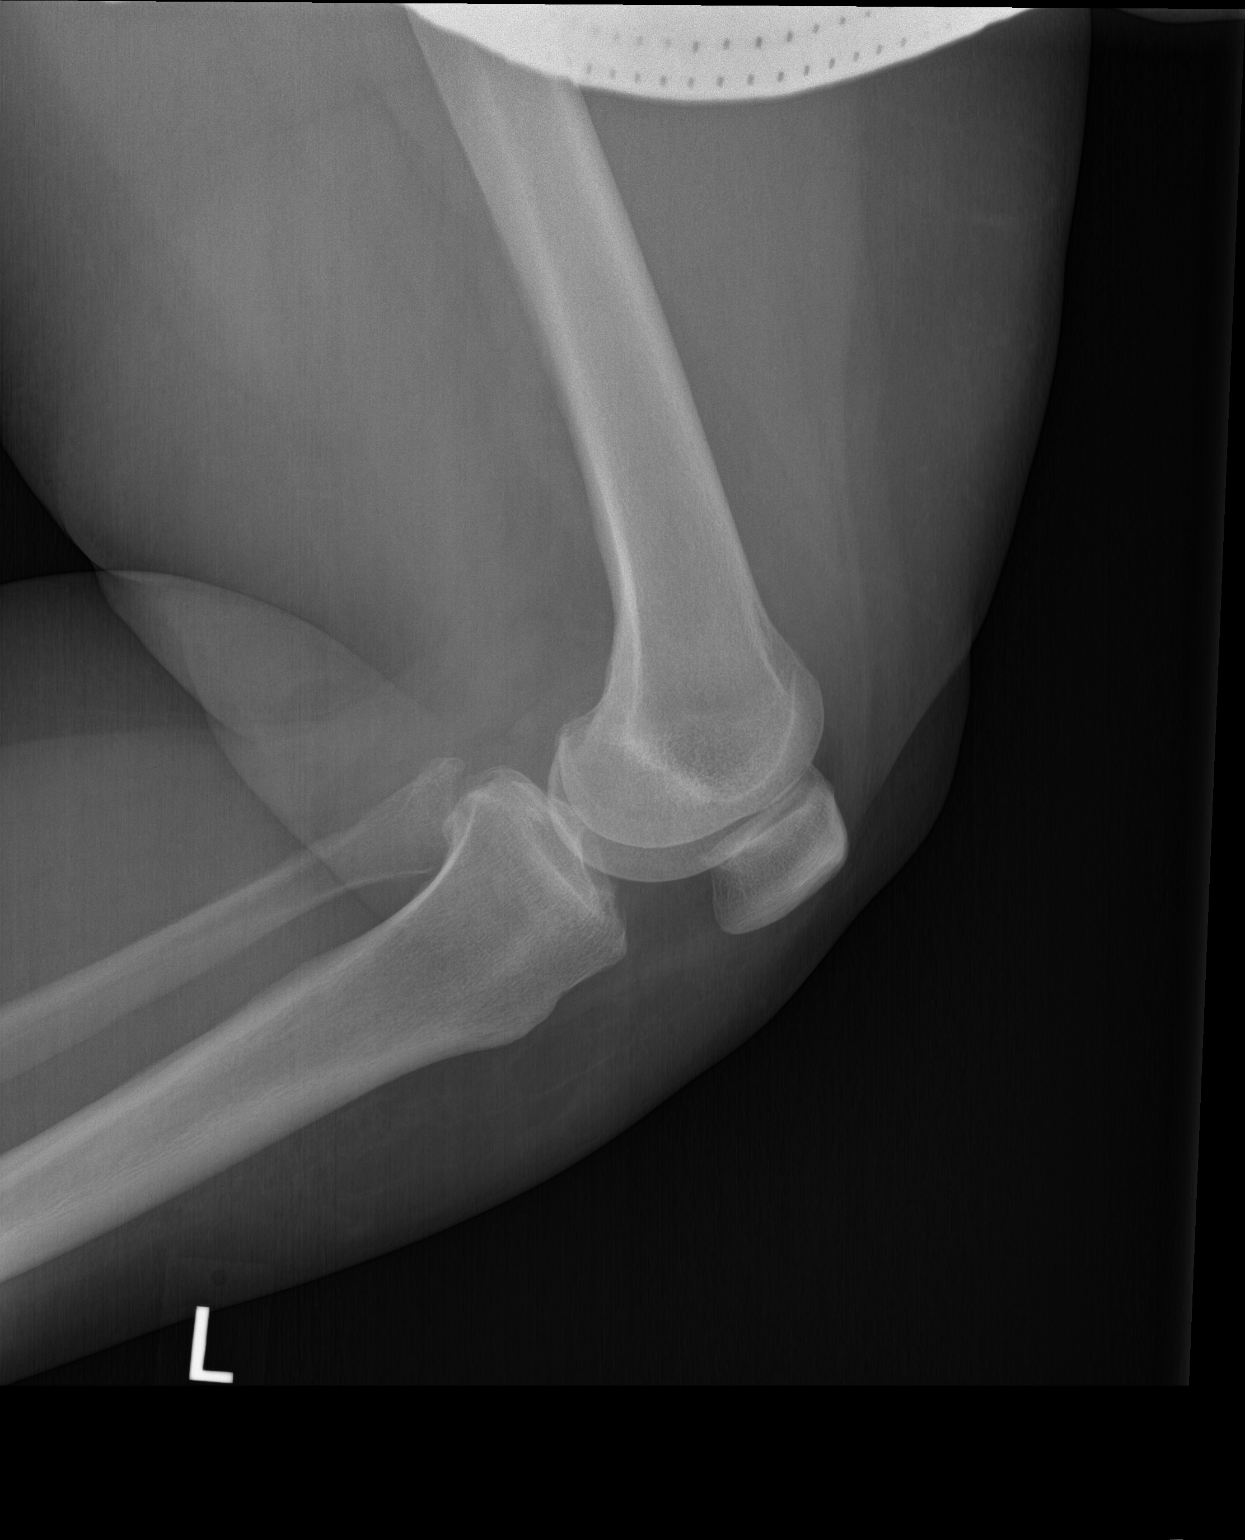

[sunrise]
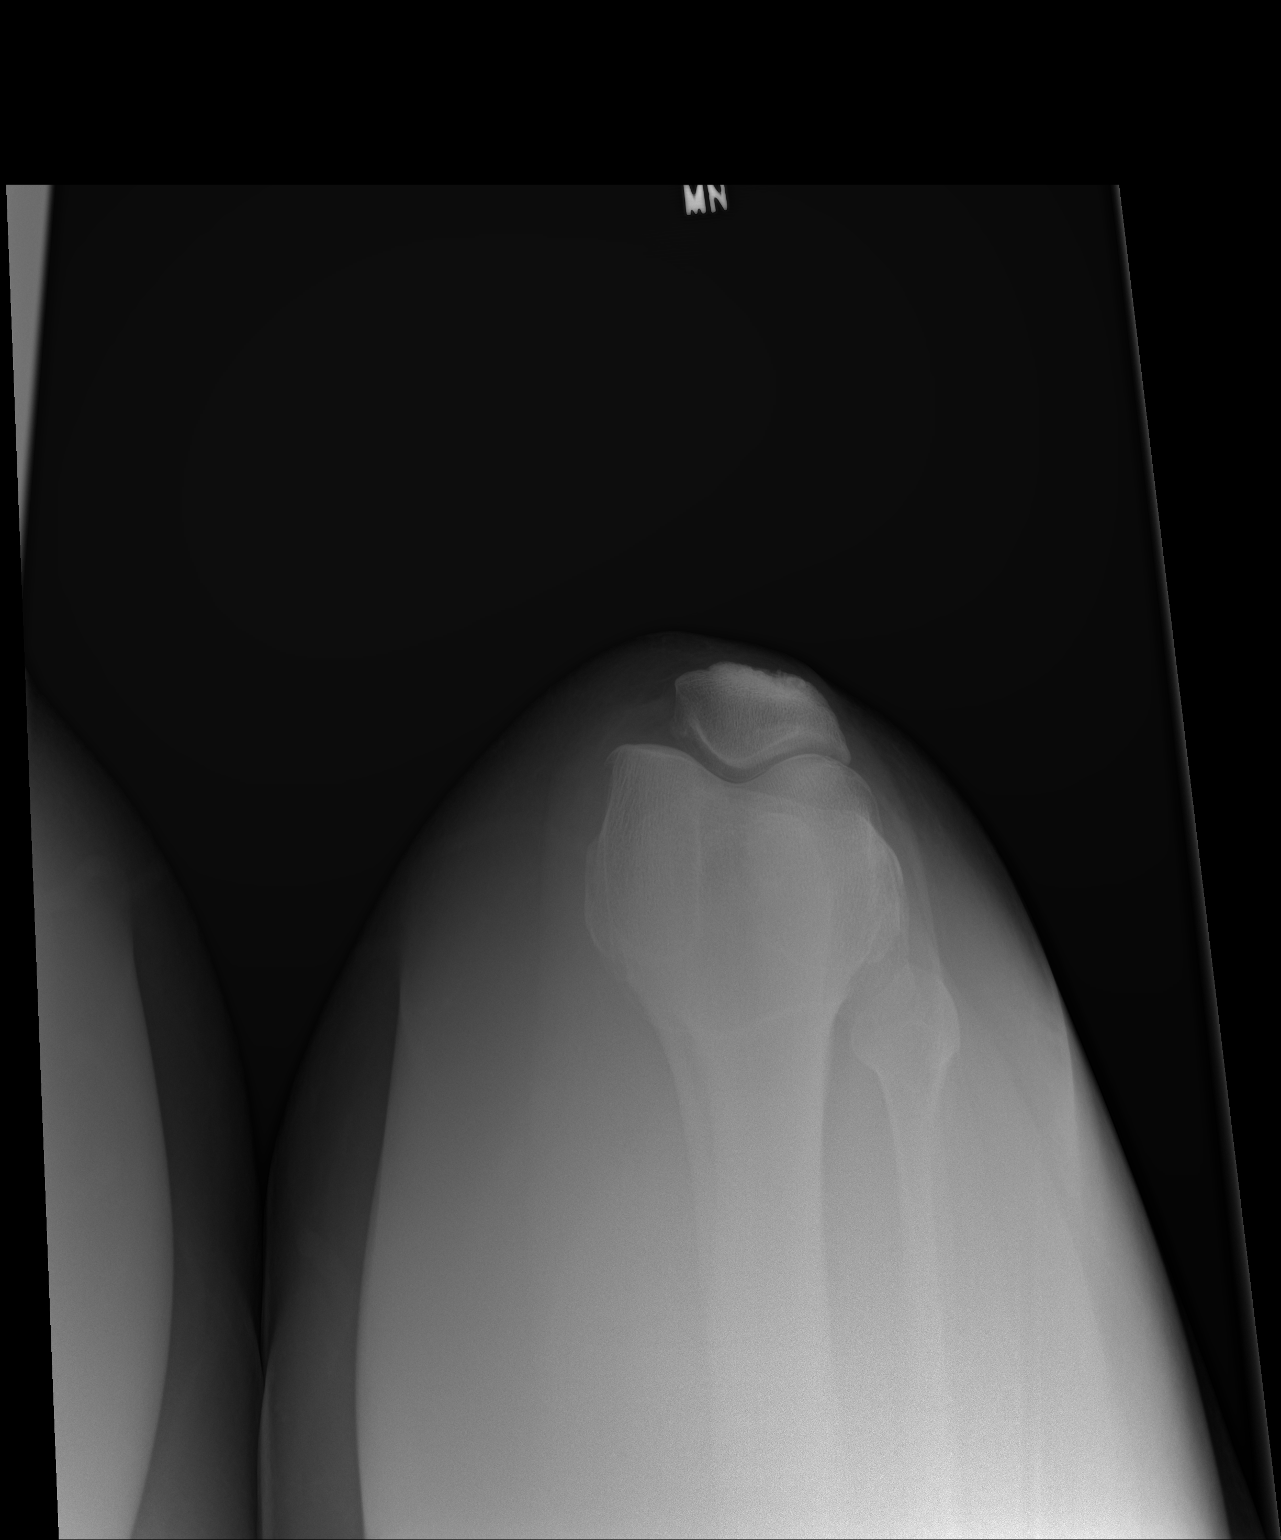

[knee [person_name]]
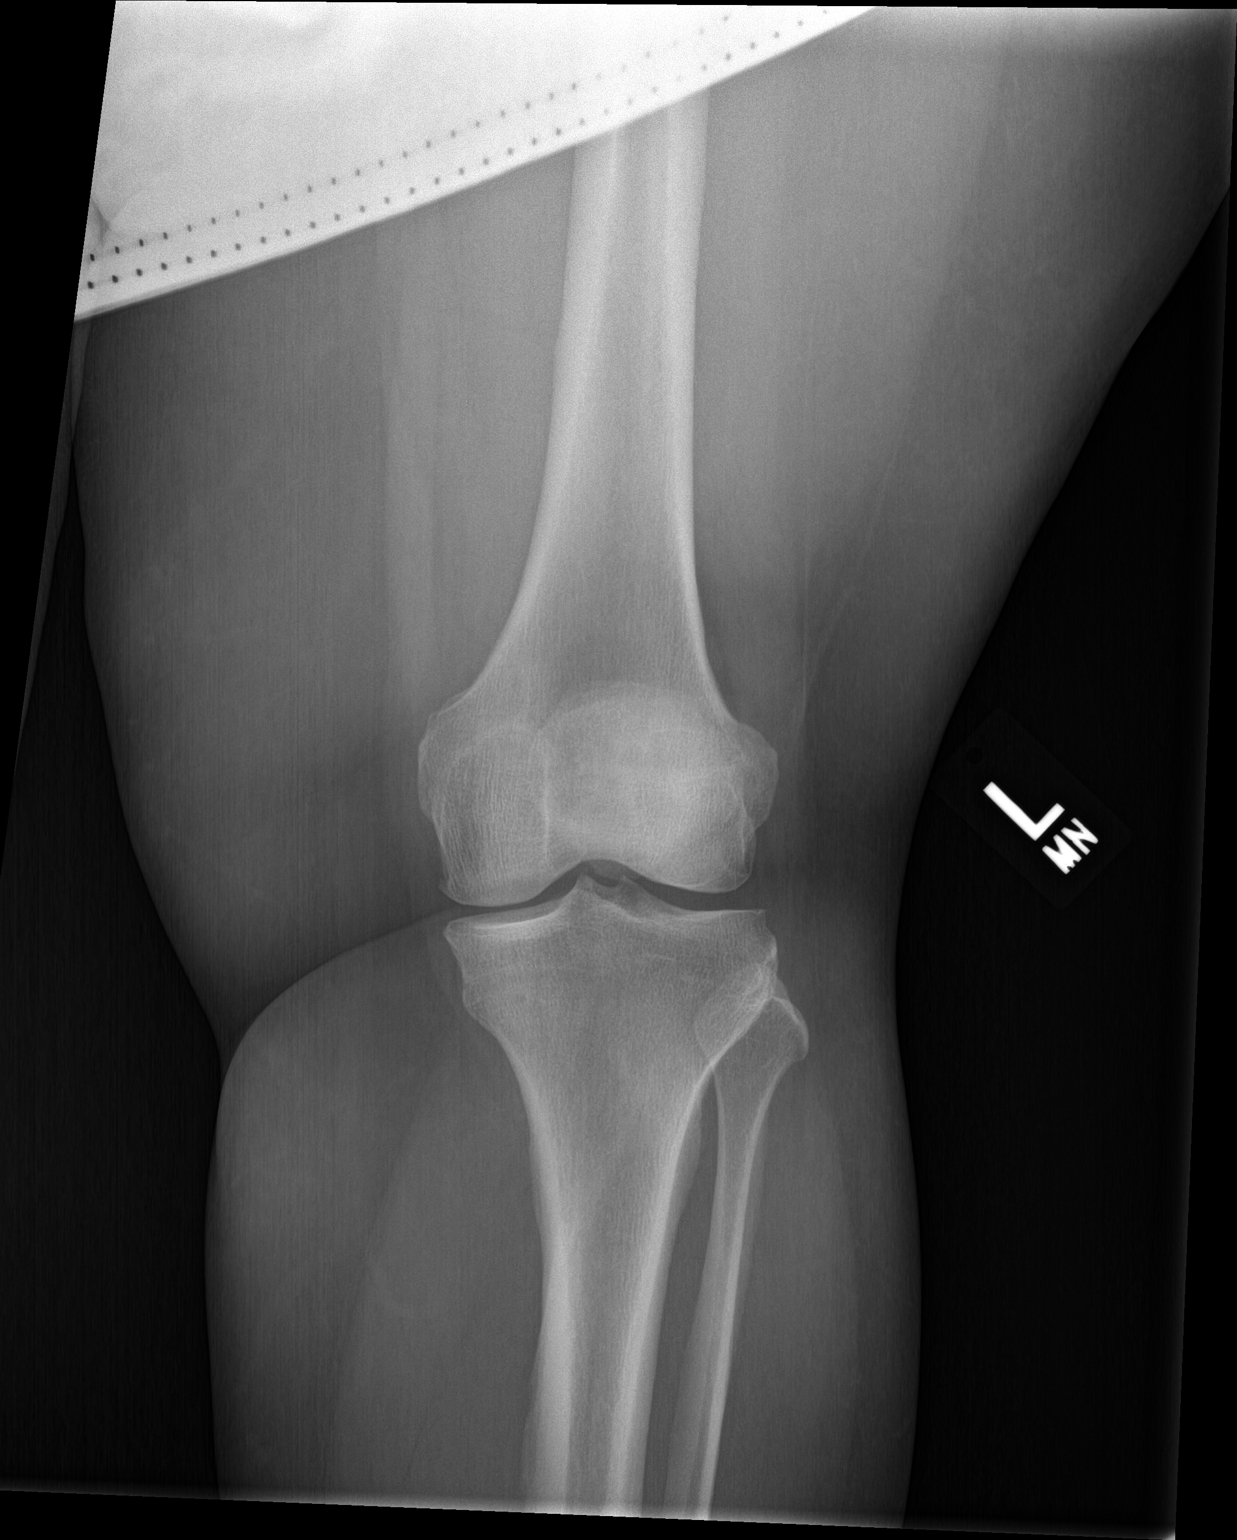

[3 of 3 positions shown; findings below may reference images not displayed]

FINDINGS: Moderate tricompartmental degenerative changes with joint space
narrowing and osteophytic spurring. No acute fracture or
osteochondral lesion. No definite joint effusion.
IMPRESSION: Moderate tricompartmental degenerative changes.

## 2018-01-17 ENCOUNTER — Encounter: Payer: Self-pay | Admitting: Physician Assistant

## 2019-05-23 ENCOUNTER — Telehealth: Payer: Self-pay | Admitting: Family Medicine

## 2019-05-23 ENCOUNTER — Other Ambulatory Visit: Payer: Self-pay

## 2019-06-05 LAB — HM MAMMOGRAPHY

## 2019-07-14 ENCOUNTER — Encounter: Payer: Self-pay | Admitting: Family Medicine

## 2019-07-14 LAB — HM COLONOSCOPY

## 2019-07-17 ENCOUNTER — Other Ambulatory Visit: Payer: Self-pay | Admitting: Emergency Medicine

## 2019-07-17 ENCOUNTER — Encounter: Payer: Self-pay | Admitting: Family Medicine

## 2019-07-17 NOTE — Progress Notes (Unsigned)
Recommend repeat colonoscopy in 10 year

## 2023-07-23 ENCOUNTER — Encounter: Payer: Self-pay | Admitting: Pulmonary Disease

## 2023-07-23 ENCOUNTER — Ambulatory Visit (INDEPENDENT_AMBULATORY_CARE_PROVIDER_SITE_OTHER): Payer: BC Managed Care – PPO | Admitting: Pulmonary Disease

## 2023-07-23 VITALS — BP 129/84 | HR 74 | Temp 97.7°F | Ht 65.0 in | Wt 316.0 lb

## 2023-07-23 DIAGNOSIS — R0683 Snoring: Secondary | ICD-10-CM

## 2023-07-23 DIAGNOSIS — R053 Chronic cough: Secondary | ICD-10-CM | POA: Diagnosis not present

## 2023-07-23 NOTE — Progress Notes (Signed)
Veronica Morris    [id number]    1965-11-01  Primary Care Physician:Patient, No Pcp Per  Referring Physician: No referring provider defined for this encounter.  Chief complaint:   Patient being seen for cough and wheezing at night  HPI:  Was recently treated for pneumonia Follow-up chest x-ray shows resolution of the infiltrate  She does have an intermittent cough, does not happen every night  Does have a history of reflux for which she is on Prilosec  Eats late meals  There was concern for obstructive sleep apnea for which she had a sleep study done-suboptimal results obtained  Admits to snoring, no witnessed apneas Some dryness of the mouth in the morning No morning headaches  Never smoker   Outpatient Encounter Medications as of 07/23/2023  Medication Sig   cetirizine (ZYRTEC) 10 MG tablet Take 10 mg by mouth daily.   levothyroxine (SYNTHROID) 50 MCG tablet Take 50 mcg by mouth daily.   omeprazole (PRILOSEC) 40 MG capsule TAKE 1 CAPSULE BY MOUTH 20 MINUTES BEFORE BREAKFAST 90 DAYS   ciprofloxacin (CIPRO) 750 MG tablet Take 1 tablet (750 mg total) by mouth 2 (two) times daily. (Patient not taking: Reported on 07/23/2023)   ciprofloxacin-dexamethasone (CIPRODEX) OTIC suspension Place 4 drops into the left ear 2 (two) times daily. (Patient not taking: Reported on 07/23/2023)   No facility-administered encounter medications on file as of 07/23/2023.    Allergies as of 07/23/2023 - Review Complete 07/23/2023  Allergen Reaction Noted   Codeine Nausea Only and Other (See Comments) 11/30/2022    Past Medical History:  Diagnosis Date   Concussion     Past Surgical History:  Procedure Laterality Date   CHOLECYSTECTOMY      Family History  Problem Relation Age of Onset   Fibromyalgia Mother    Restless legs syndrome Mother    Arthritis Father     Social History   Socioeconomic History   Marital status: Divorced    Spouse name: Not on file    Number of children: Not on file   Years of education: Not on file   Highest education level: Not on file  Occupational History   Not on file  Tobacco Use   Smoking status: Never   Smokeless tobacco: Never  Vaping Use   Vaping status: Never Used  Substance and Sexual Activity   Alcohol use: No    Comment: occ   Drug use: No   Sexual activity: Never    Birth control/protection: Abstinence  Other Topics Concern   Not on file  Social History Narrative   Not on file   Social Determinants of Health   Financial Resource Strain: Not on file  Food Insecurity: Not on file  Transportation Needs: Not on file  Physical Activity: Not on file  Stress: Not on file  Social Connections: Not on file  Intimate Partner Violence: Not on file    Review of Systems  Respiratory:  Positive for choking.   Psychiatric/Behavioral:  Positive for sleep disturbance.     There were no vitals filed for this visit.   Physical Exam Constitutional:      Appearance: She is obese.  HENT:     Head: Normocephalic.     Mouth/Throat:     Mouth: Mucous membranes are moist.  Eyes:     General: No scleral icterus. Cardiovascular:     Rate and Rhythm: Normal rate and regular rhythm.     Heart  sounds: No murmur heard.    No friction rub.  Pulmonary:     Effort: No respiratory distress.     Breath sounds: No stridor. No wheezing or rhonchi.  Musculoskeletal:     Cervical back: No rigidity or tenderness.  Neurological:     Mental Status: She is alert.  Psychiatric:        Mood and Affect: Mood normal.    Data Reviewed: Report of x-rays reviewed from care everywhere  Assessment:  Chronic cough -May be related to reflux -May be related to recent regular respiratory infection which is resolved on chest x-ray  Cough appears to be getting better  Concern for obstructive sleep apnea for which she recently had a sleep study that was suboptimal -Repeat study is  appropriate  Plan/Recommendations: Schedule for home sleep test  Weight loss efforts encouraged  Graded exercise as tolerated  Sleeping with the head of bed elevated encouraged  Ensuring about 4 hours from sleep time for last meal encouraged  Tentative follow-up in about 6 to 8 weeks  Encouraged to call with significant concerns   Virl Diamond MD [facility] 07/23/2023, 3:37 PM  CC: No ref. provider found

## 2023-07-23 NOTE — Patient Instructions (Signed)
Schedule for home sleep test  Trying make sure that stomachs empty before bedtime 4 to 5 hours before bedtime before laying down  Take every flex medication on a regular basis  Weight loss efforts  We can consider a breathing study if your cough does not continue to get better  Call us with ongoing concerns

## 2023-10-04 ENCOUNTER — Ambulatory Visit: Payer: BC Managed Care – PPO | Admitting: Pulmonary Disease

## 2023-10-18 ENCOUNTER — Encounter

## 2023-10-18 ENCOUNTER — Telehealth: Payer: Self-pay | Admitting: Pulmonary Disease

## 2023-10-18 DIAGNOSIS — G4733 Obstructive sleep apnea (adult) (pediatric): Secondary | ICD-10-CM | POA: Diagnosis not present

## 2023-10-18 NOTE — Telephone Encounter (Signed)
Call patient  Sleep study result  Date of study: 09/29/2023  Impression: Moderate obstructive sleep apnea with severe oxygen desaturations.  AHI of 21.7, O2 nadir of 74%  Recommendation: DME referral  Recommend CPAP therapy for moderate obstructive sleep apnea  Auto titrating CPAP with pressure settings of 5-15 will be appropriate  Encourage weight loss measures  Follow-up in the office 4 to 6 weeks following initiation of treatment

## 2023-10-19 NOTE — Telephone Encounter (Signed)
Called and spoke with patient, advised of results/recommendations per Dr. Wynona Neat.  She had questions regarding the results and how the sleep study went and if the results were accurate.  She said there was an issue on the second night and she had to call [facility] because of an error code.  I advised her that we could hold off on ordering the CPAP machine at this time if she was more comfortable with that so she could get all of her questions answered.  She is scheduled to see Dr. Wynona Neat on 11/21/23 at 8:30 am, advised to arrive by 8:15 am for check in.  Nothing further needed.

## 2023-10-22 ENCOUNTER — Encounter: Payer: Self-pay | Admitting: Pulmonary Disease

## 2023-10-22 DIAGNOSIS — G4733 Obstructive sleep apnea (adult) (pediatric): Secondary | ICD-10-CM

## 2023-10-23 NOTE — Telephone Encounter (Signed)
 Sleep study was read on 10/18/23 but it hasn't been scanned into Epic yet

## 2023-10-23 NOTE — Telephone Encounter (Signed)
 Sleep study not found in chart. Can someone make sure this gets added? Patient would like copy. Thanks!

## 2023-11-21 ENCOUNTER — Ambulatory Visit: Payer: BC Managed Care – PPO | Admitting: Pulmonary Disease

## 2023-12-10 NOTE — Telephone Encounter (Signed)
 It appears that the order for the home sleep test auto-canceled. The patient has completed the study on [facility] & Dr. Gaynell Keeler already signed. Could we please have the order reopened so I can upload the results into Epic? Thanks!

## 2024-01-16 ENCOUNTER — Ambulatory Visit: Admitting: Pulmonary Disease

## 2024-01-16 ENCOUNTER — Encounter: Payer: Self-pay | Admitting: Pulmonary Disease

## 2024-01-16 VITALS — BP 120/70 | HR 74 | Ht 65.0 in | Wt 314.0 lb

## 2024-01-16 DIAGNOSIS — G4733 Obstructive sleep apnea (adult) (pediatric): Secondary | ICD-10-CM | POA: Diagnosis not present

## 2024-01-16 NOTE — Progress Notes (Signed)
 Veronica Morris    [id number]    1966-07-07  Primary Care Physician:Patient, No Pcp Per  Referring Physician: No referring provider defined for this encounter.  Chief complaint:   Follow-up for moderate sleep apnea  HPI:  Was initially seen for pneumonia, concern for sleep apnea  Found to have moderate obstructive sleep apnea, has been using CPAP therapy  Coughing has improved significantly  Denies any significant ongoing symptoms at present  Never smoker   Outpatient Encounter Medications as of 01/16/2024  Medication Sig   ciprofloxacin  (CIPRO ) 750 MG tablet Take 1 tablet (750 mg total) by mouth 2 (two) times daily.   fexofenadine (ALLEGRA) 180 MG tablet TAKE 1 TABLET ONCE A DAY IN THE MORNING 30 DAYS   levocetirizine (XYZAL) 5 MG tablet TAKE 1 TABLET IN THE EVENING ONCE A DAY PM 30 DAYS   levothyroxine (SYNTHROID) 50 MCG tablet Take 50 mcg by mouth daily.   oxybutynin (DITROPAN-XL) 10 MG 24 hr tablet TAKE 1 TABLET BY MOUTH EVERY DAY IN THE MORNING   cetirizine (ZYRTEC) 10 MG tablet Take 10 mg by mouth daily. (Patient not taking: Reported on 01/16/2024)   ciprofloxacin -dexamethasone  (CIPRODEX ) OTIC suspension Place 4 drops into the left ear 2 (two) times daily. (Patient not taking: Reported on 01/16/2024)   omeprazole (PRILOSEC) 40 MG capsule TAKE 1 CAPSULE BY MOUTH 20 MINUTES BEFORE BREAKFAST 90 DAYS (Patient not taking: Reported on 01/16/2024)   No facility-administered encounter medications on file as of 01/16/2024.    Allergies as of 01/16/2024 - Review Complete 01/16/2024  Allergen Reaction Noted   Codeine Nausea Only and Other (See Comments) 11/30/2022    Past Medical History:  Diagnosis Date   Concussion     Past Surgical History:  Procedure Laterality Date   CHOLECYSTECTOMY      Family History  Problem Relation Age of Onset   Fibromyalgia Mother    Restless legs syndrome Mother    Arthritis Father     Social History   Socioeconomic  History   Marital status: Divorced    Spouse name: Not on file   Number of children: Not on file   Years of education: Not on file   Highest education level: Not on file  Occupational History   Not on file  Tobacco Use   Smoking status: Never   Smokeless tobacco: Never  Vaping Use   Vaping status: Never Used  Substance and Sexual Activity   Alcohol use: No    Comment: occ   Drug use: No   Sexual activity: Never    Birth control/protection: Abstinence  Other Topics Concern   Not on file  Social History Narrative   Not on file   Social Drivers of Health   Financial Resource Strain: Not on file  Food Insecurity: Not on file  Transportation Needs: Not on file  Physical Activity: Not on file  Stress: Not on file  Social Connections: Not on file  Intimate Partner Violence: Not on file    Review of Systems  Respiratory:  Positive for apnea. Negative for choking.   Psychiatric/Behavioral:  Positive for sleep disturbance.     Vitals:   01/16/24 0901  BP: 120/70  Pulse: 74  SpO2: 96%     Physical Exam Constitutional:      Appearance: She is obese.  HENT:     Head: Normocephalic.     Mouth/Throat:     Mouth: Mucous membranes are moist.  Eyes:  General: No scleral icterus. Cardiovascular:     Rate and Rhythm: Normal rate and regular rhythm.     Heart sounds: No murmur heard.    No friction rub.  Pulmonary:     Effort: No respiratory distress.     Breath sounds: No stridor. No wheezing or rhonchi.  Musculoskeletal:     Cervical back: No rigidity or tenderness.  Neurological:     Mental Status: She is alert.  Psychiatric:        Mood and Affect: Mood normal.    Data Reviewed: Download from her machine shows excellent compliance at 97% Average use of 7 hours 19 minutes AutoSet 5-15 Residual AHI of 0.6  Assessment:  Moderate obstructive sleep apnea Tolerating CPAP well  Plan/Recommendations: Continue weight loss efforts  Continue CPAP on a  nightly basis  Follow-up in about 6 months  Encouraged to call us  with any significant concerns    Myer Artis MD [facility] 01/16/2024, 9:23 AM  CC: No ref. provider found

## 2024-01-16 NOTE — Patient Instructions (Signed)
 Download from your machine shows that is working effectively to treat sleep apnea events  Continue using it on a nightly basis  Continue weight loss efforts  Call us  with significant concerns  Will see you back in about 6 months

## 2024-01-17 ENCOUNTER — Other Ambulatory Visit: Payer: Self-pay | Admitting: Family Medicine

## 2024-01-17 DIAGNOSIS — N611 Abscess of the breast and nipple: Secondary | ICD-10-CM

## 2024-01-17 DIAGNOSIS — M542 Cervicalgia: Secondary | ICD-10-CM

## 2024-01-29 ENCOUNTER — Other Ambulatory Visit

## 2024-01-29 ENCOUNTER — Encounter

## 2024-01-31 ENCOUNTER — Ambulatory Visit
Admission: RE | Admit: 2024-01-31 | Discharge: 2024-01-31 | Disposition: A | Discharge status: Home / Self Care | Source: Ambulatory Visit | Attending: Family Medicine | Admitting: Family Medicine

## 2024-01-31 DIAGNOSIS — N611 Abscess of the breast and nipple: Secondary | ICD-10-CM

## 2024-01-31 DIAGNOSIS — M542 Cervicalgia: Secondary | ICD-10-CM

## 2024-04-23 ENCOUNTER — Encounter: Payer: Self-pay | Admitting: Pulmonary Disease
